# Patient Record
Sex: Female | Born: 1985
Health system: Southern US, Community
[De-identification: ages and names within clinical notes are randomized; demographics above are authoritative.]

## PROBLEM LIST (undated history)

## (undated) DIAGNOSIS — R112 Nausea with vomiting, unspecified: Secondary | ICD-10-CM

## (undated) DIAGNOSIS — Z9889 Other specified postprocedural states: Secondary | ICD-10-CM

## (undated) HISTORY — PX: DILATION AND CURETTAGE OF UTERUS: SHX78

## (undated) HISTORY — PX: TONSILLECTOMY: SUR1361

---

## 2017-08-17 DIAGNOSIS — J209 Acute bronchitis, unspecified: Secondary | ICD-10-CM | POA: Diagnosis not present

## 2017-10-07 ENCOUNTER — Other Ambulatory Visit: Payer: Self-pay

## 2017-10-07 ENCOUNTER — Ambulatory Visit (HOSPITAL_COMMUNITY)
Admission: EM | Admit: 2017-10-07 | Discharge: 2017-10-07 | Disposition: A | Discharge status: Home / Self Care | Payer: Federal, State, Local not specified - PPO | Attending: Physician Assistant | Admitting: Physician Assistant

## 2017-10-07 ENCOUNTER — Encounter (HOSPITAL_COMMUNITY): Payer: Self-pay | Admitting: Emergency Medicine

## 2017-10-07 DIAGNOSIS — N390 Urinary tract infection, site not specified: Secondary | ICD-10-CM | POA: Diagnosis not present

## 2017-10-07 LAB — POCT URINALYSIS DIP (DEVICE)
BILIRUBIN URINE: NEGATIVE
GLUCOSE, UA: NEGATIVE mg/dL
KETONES UR: NEGATIVE mg/dL
Nitrite: NEGATIVE
Protein, ur: NEGATIVE mg/dL
Specific Gravity, Urine: 1.01 (ref 1.005–1.030)
Urobilinogen, UA: 0.2 mg/dL (ref 0.0–1.0)
pH: 6 (ref 5.0–8.0)

## 2017-10-07 LAB — POCT PREGNANCY, URINE: PREG TEST UR: NEGATIVE

## 2017-10-07 MED ORDER — CIPROFLOXACIN HCL 500 MG PO TABS: 500.0000 mg | ORAL_TABLET | Freq: Two times a day (BID) | ORAL | 0 refills | Status: AC

## 2017-10-07 NOTE — ED Provider Notes (Addendum)
10/07/2017 7:02 PM   DOB: [birth year] / MRN: [id number]  SUBJECTIVE:  Brianna Hess is a 32 y.o. female presenting for   She has No Known Allergies.   She  has no past medical history on file.    She  reports that she has never smoked. She does not have any smokeless tobacco history on file. She reports that she drinks alcohol. She reports that she does not use drugs. She  has no sexual activity history on file. The patient  has a past surgical history that includes Cesarean section classical.  Her family history includes Healthy in her mother; Sarcoidosis in her father.  Review of Systems  Constitutional: Negative for chills, diaphoresis and fever.  Eyes: Negative.   Respiratory: Negative for shortness of breath.   Cardiovascular: Negative for chest pain, orthopnea and leg swelling.  Gastrointestinal: Negative for nausea.  Genitourinary: Positive for dysuria, flank pain, frequency, hematuria and urgency.  Skin: Negative for rash.  Neurological: Negative for dizziness, sensory change, speech change, focal weakness and headaches.    OBJECTIVE:  BP 136/86 (BP Location: Right Arm)   Pulse 64   Temp 97.6 F (36.4 C) (Oral)   Resp 18   LMP 09/21/2017   SpO2 97%   Physical Exam  Constitutional: She is active.  Non-toxic appearance.  Cardiovascular: Normal rate, regular rhythm, S1 normal, S2 normal, normal heart sounds and intact distal pulses. Exam reveals no gallop, no friction rub and no decreased pulses.  No murmur heard. Pulmonary/Chest: Effort normal. No stridor. No tachypnea. No respiratory distress. She has no wheezes. She has no rales.  Abdominal: Soft. Bowel sounds are normal. She exhibits no mass. There is no tenderness. There is no rebound and no guarding.  Musculoskeletal: She exhibits no edema.  Neurological: She is alert.  Skin: Skin is warm and dry. She is not diaphoretic. No pallor.    Results for orders placed or performed during the hospital encounter of  10/07/17 (from the past 72 hour(s))  POCT urinalysis dip (device)     Status: Abnormal   Collection Time: 10/07/17  6:33 PM  Result Value Ref Range   Glucose, UA NEGATIVE NEGATIVE mg/dL   Bilirubin Urine NEGATIVE NEGATIVE   Ketones, ur NEGATIVE NEGATIVE mg/dL   Specific Gravity, Urine 1.010 1.005 - 1.030   Hgb urine dipstick MODERATE (A) NEGATIVE   pH 6.0 5.0 - 8.0   Protein, ur NEGATIVE NEGATIVE mg/dL   Urobilinogen, UA 0.2 0.0 - 1.0 mg/dL   Nitrite NEGATIVE NEGATIVE   Leukocytes, UA MODERATE (A) NEGATIVE    Comment: Biochemical Testing Only. Please order routine urinalysis from main lab if confirmatory testing is needed.  Pregnancy, urine POC     Status: None   Collection Time: 10/07/17  6:40 PM  Result Value Ref Range   Preg Test, Ur NEGATIVE NEGATIVE    Comment:        THE SENSITIVITY OF THIS METHODOLOGY IS >24 mIU/mL     No results found.  ASSESSMENT AND PLAN:  Orders Placed This Encounter  Procedures  . Urine culture    Standing Status:   Standing    Number of Occurrences:   1  . POCT urinalysis dip (device)    Standing Status:   Standing    Number of Occurrences:   1  . Pregnancy, urine POC    Standing Status:   Standing    Number of Occurrences:   1     Lower urinary tract infectious disease: Despite  taking 3 days of Keflex and Septra she continues to have leukocytes.  She denies vaginal discharge.  She is trying to conceive a child at this time and does not want a KUB secondary to the radiation.  She will try to push fluids.  She will come back to my office at [address] if her symptoms are persisting so we can think about other etiologies and order tests as necessary.     The patient is advised to call or return to clinic if she does not see an improvement in symptoms, or to seek the care of the closest emergency department if she worsens with the above plan.   Deliah BostonMichael Able Malloy, MHS, PA-C 10/07/2017 7:02 PM    Ofilia Neaslark, Mendy Chou L, PA-C 10/07/17 1903     Ofilia Neaslark, Vitali Seibert L, PA-C 10/07/17 1903

## 2017-10-07 NOTE — ED Triage Notes (Signed)
History of uti.  Symptoms started one week ago.  Symptoms include dysuria, hematuria and vomiting.  Has had a 3 day course of keflex.  Symptoms did not go away.  Then had a 3 day course of bactrim.  Felt some improvement, but symptoms returned.

## 2017-10-07 NOTE — Discharge Instructions (Addendum)
You are currently not pregnant.  Please use protection throughout using the Cipro.  If this problem does not seem to resolve please come to [place] office [place] [address] [place] so we can consider our next steps.  Hopefully this will be the end of it.

## 2017-10-08 LAB — CERVICOVAGINAL ANCILLARY ONLY
Bacterial vaginitis: POSITIVE — AB
Candida vaginitis: NEGATIVE
Chlamydia: NEGATIVE
Neisseria Gonorrhea: NEGATIVE
Trichomonas: NEGATIVE

## 2017-10-08 NOTE — Progress Notes (Signed)
I have spoken with the patient regarding these results.  Given the extended duration of her symptoms and improvement with previous ABX therapy she would like to complete cipro before acting on the positive gardnerella.  [facility] is [facility] if her symptoms do not improve.

## 2017-10-09 LAB — URINE CULTURE

## 2017-12-31 DIAGNOSIS — Z3481 Encounter for supervision of other normal pregnancy, first trimester: Secondary | ICD-10-CM | POA: Diagnosis not present

## 2017-12-31 DIAGNOSIS — Z3480 Encounter for supervision of other normal pregnancy, unspecified trimester: Secondary | ICD-10-CM | POA: Diagnosis not present

## 2018-01-08 DIAGNOSIS — Z3201 Encounter for pregnancy test, result positive: Secondary | ICD-10-CM | POA: Diagnosis not present

## 2018-01-21 DIAGNOSIS — Z3A09 9 weeks gestation of pregnancy: Secondary | ICD-10-CM | POA: Diagnosis not present

## 2018-01-21 DIAGNOSIS — Z681 Body mass index (BMI) 19 or less, adult: Secondary | ICD-10-CM | POA: Diagnosis not present

## 2018-01-21 DIAGNOSIS — O209 Hemorrhage in early pregnancy, unspecified: Secondary | ICD-10-CM | POA: Diagnosis not present

## 2018-01-30 DIAGNOSIS — Z3689 Encounter for other specified antenatal screening: Secondary | ICD-10-CM | POA: Diagnosis not present

## 2018-01-30 DIAGNOSIS — Z124 Encounter for screening for malignant neoplasm of cervix: Secondary | ICD-10-CM | POA: Diagnosis not present

## 2018-01-30 DIAGNOSIS — Z3A10 10 weeks gestation of pregnancy: Secondary | ICD-10-CM | POA: Diagnosis not present

## 2018-01-30 DIAGNOSIS — O209 Hemorrhage in early pregnancy, unspecified: Secondary | ICD-10-CM | POA: Diagnosis not present

## 2018-01-30 LAB — OB RESULTS CONSOLE ANTIBODY SCREEN: Antibody Screen: POSITIVE

## 2018-01-30 LAB — OB RESULTS CONSOLE GC/CHLAMYDIA
Chlamydia: NEGATIVE
GC PROBE AMP, GENITAL: NEGATIVE

## 2018-01-30 LAB — OB RESULTS CONSOLE RPR: RPR: NONREACTIVE

## 2018-01-30 LAB — OB RESULTS CONSOLE ABO/RH: RH Type: NEGATIVE

## 2018-01-30 LAB — OB RESULTS CONSOLE RUBELLA ANTIBODY, IGM: RUBELLA: IMMUNE

## 2018-01-30 LAB — OB RESULTS CONSOLE HEPATITIS B SURFACE ANTIGEN: Hepatitis B Surface Ag: NEGATIVE

## 2018-01-30 LAB — OB RESULTS CONSOLE HIV ANTIBODY (ROUTINE TESTING): HIV: NONREACTIVE

## 2018-02-15 DIAGNOSIS — Z3682 Encounter for antenatal screening for nuchal translucency: Secondary | ICD-10-CM | POA: Diagnosis not present

## 2018-02-15 DIAGNOSIS — O209 Hemorrhage in early pregnancy, unspecified: Secondary | ICD-10-CM | POA: Diagnosis not present

## 2018-02-15 DIAGNOSIS — Z3A13 13 weeks gestation of pregnancy: Secondary | ICD-10-CM | POA: Diagnosis not present

## 2018-03-15 DIAGNOSIS — Z361 Encounter for antenatal screening for raised alphafetoprotein level: Secondary | ICD-10-CM | POA: Diagnosis not present

## 2018-03-15 DIAGNOSIS — Z3A16 16 weeks gestation of pregnancy: Secondary | ICD-10-CM | POA: Diagnosis not present

## 2018-03-15 DIAGNOSIS — O209 Hemorrhage in early pregnancy, unspecified: Secondary | ICD-10-CM | POA: Diagnosis not present

## 2018-04-01 DIAGNOSIS — Z363 Encounter for antenatal screening for malformations: Secondary | ICD-10-CM | POA: Diagnosis not present

## 2018-04-01 DIAGNOSIS — Z3A19 19 weeks gestation of pregnancy: Secondary | ICD-10-CM | POA: Diagnosis not present

## 2018-04-01 DIAGNOSIS — O4402 Placenta previa specified as without hemorrhage, second trimester: Secondary | ICD-10-CM | POA: Diagnosis not present

## 2018-05-02 DIAGNOSIS — Z3A23 23 weeks gestation of pregnancy: Secondary | ICD-10-CM | POA: Diagnosis not present

## 2018-05-02 DIAGNOSIS — O4402 Placenta previa specified as without hemorrhage, second trimester: Secondary | ICD-10-CM | POA: Diagnosis not present

## 2018-05-13 DIAGNOSIS — Z3689 Encounter for other specified antenatal screening: Secondary | ICD-10-CM | POA: Diagnosis not present

## 2018-05-13 DIAGNOSIS — O4402 Placenta previa specified as without hemorrhage, second trimester: Secondary | ICD-10-CM | POA: Diagnosis not present

## 2018-05-13 DIAGNOSIS — Z3A25 25 weeks gestation of pregnancy: Secondary | ICD-10-CM | POA: Diagnosis not present

## 2018-06-03 DIAGNOSIS — Z3A28 28 weeks gestation of pregnancy: Secondary | ICD-10-CM | POA: Diagnosis not present

## 2018-06-03 DIAGNOSIS — O4403 Placenta previa specified as without hemorrhage, third trimester: Secondary | ICD-10-CM | POA: Diagnosis not present

## 2018-06-03 DIAGNOSIS — Z23 Encounter for immunization: Secondary | ICD-10-CM | POA: Diagnosis not present

## 2018-06-03 DIAGNOSIS — O360130 Maternal care for anti-D [Rh] antibodies, third trimester, not applicable or unspecified: Secondary | ICD-10-CM | POA: Diagnosis not present

## 2018-06-03 DIAGNOSIS — Z3689 Encounter for other specified antenatal screening: Secondary | ICD-10-CM | POA: Diagnosis not present

## 2018-06-17 DIAGNOSIS — Z3A30 30 weeks gestation of pregnancy: Secondary | ICD-10-CM | POA: Diagnosis not present

## 2018-06-17 DIAGNOSIS — O360130 Maternal care for anti-D [Rh] antibodies, third trimester, not applicable or unspecified: Secondary | ICD-10-CM | POA: Diagnosis not present

## 2018-07-05 DIAGNOSIS — O360130 Maternal care for anti-D [Rh] antibodies, third trimester, not applicable or unspecified: Secondary | ICD-10-CM | POA: Diagnosis not present

## 2018-07-05 DIAGNOSIS — Z3A32 32 weeks gestation of pregnancy: Secondary | ICD-10-CM | POA: Diagnosis not present

## 2018-07-18 DIAGNOSIS — Z3A34 34 weeks gestation of pregnancy: Secondary | ICD-10-CM | POA: Diagnosis not present

## 2018-07-18 DIAGNOSIS — O360130 Maternal care for anti-D [Rh] antibodies, third trimester, not applicable or unspecified: Secondary | ICD-10-CM | POA: Diagnosis not present

## 2018-07-31 ENCOUNTER — Other Ambulatory Visit: Payer: Self-pay | Admitting: Obstetrics & Gynecology

## 2018-08-02 ENCOUNTER — Encounter (HOSPITAL_COMMUNITY): Payer: Self-pay | Admitting: *Deleted

## 2018-08-05 DIAGNOSIS — O360130 Maternal care for anti-D [Rh] antibodies, third trimester, not applicable or unspecified: Secondary | ICD-10-CM | POA: Diagnosis not present

## 2018-08-05 DIAGNOSIS — Z3685 Encounter for antenatal screening for Streptococcus B: Secondary | ICD-10-CM | POA: Diagnosis not present

## 2018-08-05 DIAGNOSIS — Z3A37 37 weeks gestation of pregnancy: Secondary | ICD-10-CM | POA: Diagnosis not present

## 2018-08-13 DIAGNOSIS — Z3A38 38 weeks gestation of pregnancy: Secondary | ICD-10-CM | POA: Diagnosis not present

## 2018-08-13 DIAGNOSIS — O4403 Placenta previa specified as without hemorrhage, third trimester: Secondary | ICD-10-CM | POA: Diagnosis not present

## 2018-08-15 NOTE — Patient Instructions (Signed)
Brianna Hess  08/15/2018   Your procedure is scheduled on:  08/19/2018  Enter through the Main Entrance of [facility] at 0930 AM.  Pick up the phone at the desk and dial [phone]  Call this number if you have problems the morning of surgery:[phone]  Remember:   Do not eat food:(After Midnight) Desps de medianoche.  Do not drink clear liquids: (After Midnight) Desps de medianoche.  Take these medicines the morning of surgery with A SIP OF WATER: may take zyrtec   Do not wear jewelry, make-up or nail polish.  Do not wear lotions, powders, or perfumes. Do not wear deodorant.  Do not shave 48 hours prior to surgery.  Do not bring valuables to the hospital.  [facility] is not   responsible for any belongings or valuables brought to the hospital.  Contacts, dentures or bridgework may not be worn into surgery.  Leave suitcase in the car. After surgery it may be brought to your room.  For patients admitted to the hospital, checkout time is 11:00 AM the day of              discharge.    N/A   Please read over the following fact sheets that you were given:   Surgical Site Infection Prevention

## 2018-08-16 ENCOUNTER — Encounter (HOSPITAL_COMMUNITY)
Admission: RE | Admit: 2018-08-16 | Discharge: 2018-08-16 | Disposition: A | Discharge status: Home / Self Care | Payer: Federal, State, Local not specified - PPO | Source: Ambulatory Visit | Attending: Obstetrics & Gynecology | Admitting: Obstetrics & Gynecology

## 2018-08-16 DIAGNOSIS — Z01812 Encounter for preprocedural laboratory examination: Secondary | ICD-10-CM | POA: Diagnosis not present

## 2018-08-16 HISTORY — DX: Other specified postprocedural states: R11.2

## 2018-08-16 HISTORY — DX: Nausea with vomiting, unspecified: Z98.890

## 2018-08-16 LAB — CBC
HCT: 37.9 % (ref 36.0–46.0)
HEMOGLOBIN: 12.7 g/dL (ref 12.0–15.0)
MCH: 32.2 pg (ref 26.0–34.0)
MCHC: 33.5 g/dL (ref 30.0–36.0)
MCV: 95.9 fL (ref 80.0–100.0)
Platelets: 223 10*3/uL (ref 150–400)
RBC: 3.95 MIL/uL (ref 3.87–5.11)
RDW: 13.9 % (ref 11.5–15.5)
WBC: 8.3 10*3/uL (ref 4.0–10.5)
nRBC: 0 % (ref 0.0–0.2)

## 2018-08-16 LAB — RPR: RPR: NONREACTIVE

## 2018-08-19 ENCOUNTER — Other Ambulatory Visit: Payer: Self-pay

## 2018-08-19 ENCOUNTER — Encounter (HOSPITAL_COMMUNITY)
Admission: AD | Disposition: A | Discharge status: Home / Self Care | Payer: Self-pay | Source: Home / Self Care | Attending: Obstetrics & Gynecology

## 2018-08-19 ENCOUNTER — Inpatient Hospital Stay (HOSPITAL_COMMUNITY): Payer: Federal, State, Local not specified - PPO | Admitting: Anesthesiology

## 2018-08-19 ENCOUNTER — Encounter (HOSPITAL_COMMUNITY): Payer: Self-pay | Admitting: *Deleted

## 2018-08-19 ENCOUNTER — Inpatient Hospital Stay (HOSPITAL_COMMUNITY)
Admission: AD | Admit: 2018-08-19 | Discharge: 2018-08-21 | DRG: 787 | Disposition: A | Discharge status: Home / Self Care | Payer: Federal, State, Local not specified - PPO | Attending: Obstetrics & Gynecology | Admitting: Obstetrics & Gynecology

## 2018-08-19 DIAGNOSIS — D62 Acute posthemorrhagic anemia: Secondary | ICD-10-CM | POA: Diagnosis not present

## 2018-08-19 DIAGNOSIS — Z3A39 39 weeks gestation of pregnancy: Secondary | ICD-10-CM | POA: Diagnosis not present

## 2018-08-19 DIAGNOSIS — Z98891 History of uterine scar from previous surgery: Secondary | ICD-10-CM

## 2018-08-19 DIAGNOSIS — O26893 Other specified pregnancy related conditions, third trimester: Secondary | ICD-10-CM | POA: Diagnosis not present

## 2018-08-19 DIAGNOSIS — O34211 Maternal care for low transverse scar from previous cesarean delivery: Secondary | ICD-10-CM | POA: Diagnosis not present

## 2018-08-19 DIAGNOSIS — O9081 Anemia of the puerperium: Secondary | ICD-10-CM | POA: Diagnosis not present

## 2018-08-19 DIAGNOSIS — Z6791 Unspecified blood type, Rh negative: Secondary | ICD-10-CM

## 2018-08-19 DIAGNOSIS — O34219 Maternal care for unspecified type scar from previous cesarean delivery: Secondary | ICD-10-CM | POA: Diagnosis not present

## 2018-08-19 SURGERY — Surgical Case
Anesthesia: Spinal

## 2018-08-19 MED ORDER — ONDANSETRON HCL 4 MG/2ML IJ SOLN
INTRAMUSCULAR | Status: DC | PRN
  Administered 2018-08-19: 4 mg via INTRAVENOUS

## 2018-08-19 MED ORDER — DEXAMETHASONE SODIUM PHOSPHATE 4 MG/ML IJ SOLN
INTRAMUSCULAR | Status: AC
  Filled 2018-08-19: qty 1

## 2018-08-19 MED ORDER — PHENYLEPHRINE 40 MCG/ML (10ML) SYRINGE FOR IV PUSH (FOR BLOOD PRESSURE SUPPORT)
PREFILLED_SYRINGE | INTRAVENOUS | Status: AC
  Filled 2018-08-19: qty 10

## 2018-08-19 MED ORDER — DIPHENHYDRAMINE HCL 50 MG/ML IJ SOLN: 12.5000 mg | INTRAMUSCULAR | Status: DC | PRN

## 2018-08-19 MED ORDER — NALBUPHINE HCL 10 MG/ML IJ SOLN: 5.0000 mg | INTRAMUSCULAR | Status: DC | PRN

## 2018-08-19 MED ORDER — LACTATED RINGERS IV SOLN
INTRAVENOUS | Status: DC | PRN
  Administered 2018-08-19: 12:00:00 via INTRAVENOUS

## 2018-08-19 MED ORDER — METOCLOPRAMIDE HCL 5 MG/ML IJ SOLN
INTRAMUSCULAR | Status: DC | PRN
  Administered 2018-08-19: 10 mg via INTRAVENOUS

## 2018-08-19 MED ORDER — SODIUM CHLORIDE 0.9% FLUSH: 3.0000 mL | INTRAVENOUS | Status: DC | PRN

## 2018-08-19 MED ORDER — CEFAZOLIN SODIUM-DEXTROSE 2-4 GM/100ML-% IV SOLN
2.0000 g | INTRAVENOUS | Status: AC
  Administered 2018-08-19: 2 g via INTRAVENOUS

## 2018-08-19 MED ORDER — SIMETHICONE 80 MG PO CHEW
80.0000 mg | CHEWABLE_TABLET | Freq: Three times a day (TID) | ORAL | Status: DC
  Administered 2018-08-20 – 2018-08-21 (×4): 80 mg via ORAL
  Filled 2018-08-19 (×5): qty 1

## 2018-08-19 MED ORDER — OXYTOCIN 40 UNITS IN NORMAL SALINE INFUSION - SIMPLE MED: 2.5000 [IU]/h | INTRAVENOUS | Status: DC

## 2018-08-19 MED ORDER — OXYCODONE HCL 5 MG PO TABS: 5.0000 mg | ORAL_TABLET | Freq: Four times a day (QID) | ORAL | Status: DC | PRN

## 2018-08-19 MED ORDER — DEXAMETHASONE SODIUM PHOSPHATE 4 MG/ML IJ SOLN
INTRAMUSCULAR | Status: DC | PRN
  Administered 2018-08-19: 4 mg via INTRAVENOUS

## 2018-08-19 MED ORDER — DIBUCAINE 1 % RE OINT: 1.0000 "application " | TOPICAL_OINTMENT | RECTAL | Status: DC | PRN

## 2018-08-19 MED ORDER — PRENATAL MULTIVITAMIN CH
1.0000 | ORAL_TABLET | Freq: Every day | ORAL | Status: DC
  Administered 2018-08-20: 1 via ORAL
  Filled 2018-08-19: qty 1

## 2018-08-19 MED ORDER — DIPHENHYDRAMINE HCL 25 MG PO CAPS
25.0000 mg | ORAL_CAPSULE | ORAL | Status: DC | PRN
  Filled 2018-08-19: qty 1

## 2018-08-19 MED ORDER — SIMETHICONE 80 MG PO CHEW: 80.0000 mg | CHEWABLE_TABLET | ORAL | Status: DC | PRN

## 2018-08-19 MED ORDER — PHENYLEPHRINE 8 MG IN D5W 100 ML (0.08MG/ML) PREMIX OPTIME
INJECTION | INTRAVENOUS | Status: DC | PRN
  Administered 2018-08-19: 60 ug/min via INTRAVENOUS

## 2018-08-19 MED ORDER — NALOXONE HCL 0.4 MG/ML IJ SOLN: 0.4000 mg | INTRAMUSCULAR | Status: DC | PRN

## 2018-08-19 MED ORDER — OXYTOCIN 10 UNIT/ML IJ SOLN
INTRAMUSCULAR | Status: AC
  Filled 2018-08-19: qty 4

## 2018-08-19 MED ORDER — SIMETHICONE 80 MG PO CHEW
80.0000 mg | CHEWABLE_TABLET | ORAL | Status: DC
  Administered 2018-08-19 – 2018-08-20 (×2): 80 mg via ORAL
  Filled 2018-08-19 (×2): qty 1

## 2018-08-19 MED ORDER — SENNOSIDES-DOCUSATE SODIUM 8.6-50 MG PO TABS
2.0000 | ORAL_TABLET | ORAL | Status: DC
  Administered 2018-08-19 – 2018-08-20 (×2): 2 via ORAL
  Filled 2018-08-19 (×2): qty 2

## 2018-08-19 MED ORDER — ACETAMINOPHEN 325 MG PO TABS
650.0000 mg | ORAL_TABLET | ORAL | Status: DC | PRN
  Administered 2018-08-20 – 2018-08-21 (×3): 650 mg via ORAL
  Filled 2018-08-19 (×2): qty 2

## 2018-08-19 MED ORDER — MEPERIDINE HCL 25 MG/ML IJ SOLN
INTRAMUSCULAR | Status: AC
  Filled 2018-08-19: qty 1

## 2018-08-19 MED ORDER — BUPIVACAINE IN DEXTROSE 0.75-8.25 % IT SOLN
INTRATHECAL | Status: DC | PRN
  Administered 2018-08-19: 1.8 mL via INTRATHECAL

## 2018-08-19 MED ORDER — PHENYLEPHRINE 8 MG IN D5W 100 ML (0.08MG/ML) PREMIX OPTIME
INJECTION | INTRAVENOUS | Status: AC
  Filled 2018-08-19: qty 100

## 2018-08-19 MED ORDER — NALBUPHINE HCL 10 MG/ML IJ SOLN: 5.0000 mg | Freq: Once | INTRAMUSCULAR | Status: DC | PRN

## 2018-08-19 MED ORDER — DIPHENHYDRAMINE HCL 25 MG PO CAPS: 25.0000 mg | ORAL_CAPSULE | Freq: Four times a day (QID) | ORAL | Status: DC | PRN

## 2018-08-19 MED ORDER — ZOLPIDEM TARTRATE 5 MG PO TABS: 5.0000 mg | ORAL_TABLET | Freq: Every evening | ORAL | Status: DC | PRN

## 2018-08-19 MED ORDER — MEPERIDINE HCL 25 MG/ML IJ SOLN
INTRAMUSCULAR | Status: DC | PRN
  Administered 2018-08-19 (×2): 12.5 mg via INTRAVENOUS

## 2018-08-19 MED ORDER — LACTATED RINGERS IV SOLN
INTRAVENOUS | Status: DC
  Administered 2018-08-19: 125 mL/h via INTRAVENOUS

## 2018-08-19 MED ORDER — ONDANSETRON HCL 4 MG/2ML IJ SOLN
INTRAMUSCULAR | Status: AC
  Filled 2018-08-19: qty 2

## 2018-08-19 MED ORDER — ONDANSETRON HCL 4 MG/2ML IJ SOLN: 4.0000 mg | Freq: Three times a day (TID) | INTRAMUSCULAR | Status: DC | PRN

## 2018-08-19 MED ORDER — SCOPOLAMINE 1 MG/3DAYS TD PT72
MEDICATED_PATCH | TRANSDERMAL | Status: AC
  Filled 2018-08-19: qty 1

## 2018-08-19 MED ORDER — SCOPOLAMINE 1 MG/3DAYS TD PT72
MEDICATED_PATCH | TRANSDERMAL | Status: DC | PRN
  Administered 2018-08-19: 1 via TRANSDERMAL

## 2018-08-19 MED ORDER — WITCH HAZEL-GLYCERIN EX PADS: 1.0000 "application " | MEDICATED_PAD | CUTANEOUS | Status: DC | PRN

## 2018-08-19 MED ORDER — MORPHINE SULFATE (PF) 0.5 MG/ML IJ SOLN
INTRAMUSCULAR | Status: DC | PRN
  Administered 2018-08-19: 150 ug via INTRATHECAL

## 2018-08-19 MED ORDER — MENTHOL 3 MG MT LOZG
1.0000 | LOZENGE | OROMUCOSAL | Status: DC | PRN
  Administered 2018-08-19: 3 mg via ORAL
  Filled 2018-08-19: qty 9

## 2018-08-19 MED ORDER — MORPHINE SULFATE (PF) 0.5 MG/ML IJ SOLN
INTRAMUSCULAR | Status: AC
  Filled 2018-08-19: qty 10

## 2018-08-19 MED ORDER — LACTATED RINGERS IV SOLN
INTRAVENOUS | Status: DC
  Administered 2018-08-19 (×2): via INTRAVENOUS

## 2018-08-19 MED ORDER — TETANUS-DIPHTH-ACELL PERTUSSIS 5-2.5-18.5 LF-MCG/0.5 IM SUSP: 0.5000 mL | Freq: Once | INTRAMUSCULAR | Status: DC

## 2018-08-19 MED ORDER — IBUPROFEN 800 MG PO TABS
800.0000 mg | ORAL_TABLET | Freq: Three times a day (TID) | ORAL | Status: DC
  Administered 2018-08-19 – 2018-08-21 (×6): 800 mg via ORAL
  Filled 2018-08-19 (×6): qty 1

## 2018-08-19 MED ORDER — NALOXONE HCL 4 MG/10ML IJ SOLN
1.0000 ug/kg/h | INTRAVENOUS | Status: DC | PRN
  Filled 2018-08-19: qty 5

## 2018-08-19 MED ORDER — FENTANYL CITRATE (PF) 100 MCG/2ML IJ SOLN
INTRAMUSCULAR | Status: AC
  Filled 2018-08-19: qty 2

## 2018-08-19 MED ORDER — SODIUM CHLORIDE 0.9 % IR SOLN
Status: DC | PRN
  Administered 2018-08-19: 1

## 2018-08-19 MED ORDER — SCOPOLAMINE 1 MG/3DAYS TD PT72: 1.0000 | MEDICATED_PATCH | Freq: Once | TRANSDERMAL | Status: DC

## 2018-08-19 MED ORDER — FENTANYL CITRATE (PF) 100 MCG/2ML IJ SOLN
INTRAMUSCULAR | Status: DC | PRN
  Administered 2018-08-19: 15 ug via INTRATHECAL

## 2018-08-19 MED ORDER — LORATADINE 10 MG PO TABS: 10.0000 mg | ORAL_TABLET | Freq: Every day | ORAL | Status: DC

## 2018-08-19 MED ORDER — COCONUT OIL OIL: 1.0000 "application " | TOPICAL_OIL | Status: DC | PRN

## 2018-08-19 MED ORDER — METOCLOPRAMIDE HCL 5 MG/ML IJ SOLN
INTRAMUSCULAR | Status: AC
  Filled 2018-08-19: qty 2

## 2018-08-19 MED ORDER — OXYTOCIN 10 UNIT/ML IJ SOLN
INTRAVENOUS | Status: DC | PRN
  Administered 2018-08-19: 40 [IU] via INTRAVENOUS

## 2018-08-19 MED ORDER — LORATADINE 10 MG PO TABS
10.0000 mg | ORAL_TABLET | Freq: Every day | ORAL | Status: DC
  Administered 2018-08-19 – 2018-08-20 (×2): 10 mg via ORAL
  Filled 2018-08-19 (×2): qty 1

## 2018-08-19 SURGICAL SUPPLY — 35 items
BENZOIN TINCTURE PRP APPL 2/3 (GAUZE/BANDAGES/DRESSINGS) ×2 IMPLANT
CHLORAPREP W/TINT 26ML (MISCELLANEOUS) ×2 IMPLANT
CLAMP CORD UMBIL (MISCELLANEOUS) IMPLANT
CLOTH BEACON ORANGE TIMEOUT ST (SAFETY) ×2 IMPLANT
DRAPE C SECTION CLR SCREEN (DRAPES) ×2 IMPLANT
DRSG OPSITE POSTOP 4X10 (GAUZE/BANDAGES/DRESSINGS) ×2 IMPLANT
ELECT REM PT RETURN 9FT ADLT (ELECTROSURGICAL) ×2
ELECTRODE REM PT RTRN 9FT ADLT (ELECTROSURGICAL) ×1 IMPLANT
EXTRACTOR VACUUM KIWI (MISCELLANEOUS) IMPLANT
EXTRACTOR VACUUM M CUP 4 TUBE (SUCTIONS) IMPLANT
GLOVE BIO SURGEON STRL SZ7 (GLOVE) ×2 IMPLANT
GLOVE BIOGEL PI IND STRL 7.0 (GLOVE) ×2 IMPLANT
GLOVE BIOGEL PI INDICATOR 7.0 (GLOVE) ×2
GOWN STRL REUS W/TWL LRG LVL3 (GOWN DISPOSABLE) ×4 IMPLANT
KIT ABG SYR 3ML LUER SLIP (SYRINGE) IMPLANT
NEEDLE HYPO 25X5/8 SAFETYGLIDE (NEEDLE) IMPLANT
NS IRRIG 1000ML POUR BTL (IV SOLUTION) ×2 IMPLANT
PACK C SECTION WH (CUSTOM PROCEDURE TRAY) ×2 IMPLANT
PAD OB MATERNITY 4.3X12.25 (PERSONAL CARE ITEMS) ×2 IMPLANT
RTRCTR C-SECT PINK 25CM LRG (MISCELLANEOUS) IMPLANT
STRIP CLOSURE SKIN 1/2X4 (GAUZE/BANDAGES/DRESSINGS) ×2 IMPLANT
SUT MNCRL 0 VIOLET CTX 36 (SUTURE) ×2 IMPLANT
SUT MONOCRYL 0 CTX 36 (SUTURE) ×2
SUT PLAIN 0 NONE (SUTURE) IMPLANT
SUT PLAIN 2 0 (SUTURE)
SUT PLAIN ABS 2-0 CT1 27XMFL (SUTURE) IMPLANT
SUT VIC AB 0 CT1 27 (SUTURE) ×2
SUT VIC AB 0 CT1 27XBRD ANBCTR (SUTURE) ×2 IMPLANT
SUT VIC AB 2-0 CT1 27 (SUTURE) ×1
SUT VIC AB 2-0 CT1 TAPERPNT 27 (SUTURE) ×1 IMPLANT
SUT VIC AB 4-0 KS 27 (SUTURE) ×2 IMPLANT
SUT VICRYL 0 TIES 12 18 (SUTURE) IMPLANT
TOWEL OR 17X24 6PK STRL BLUE (TOWEL DISPOSABLE) ×2 IMPLANT
TRAY FOLEY W/BAG SLVR 14FR LF (SET/KITS/TRAYS/PACK) IMPLANT
WATER STERILE IRR 1000ML POUR (IV SOLUTION) ×2 IMPLANT

## 2018-08-19 NOTE — Op Note (Signed)
Cesarean Section Procedure Note   Brianna Hess  08/19/2018  Indications: Scheduled Proceedure/Maternal Request   Pre-operative Diagnosis: Previous Cesarean Section. 39 weeks  Post-operative Diagnosis: Same   Surgeon: Shea Evans, MD  Assistants: Arlan Organ, CNM  Anesthesia: spinal   Procedure Details:  The patient was seen in the Holding Room. The risks, benefits, complications, treatment options, and expected outcomes were discussed with the patient. The patient concurred with the proposed plan, giving informed consent. identified as Brianna Hess and the procedure verified as C-Section Delivery. A Time Out was held and the above information confirmed.  After induction of anesthesia, the patient was draped and prepped in the usual sterile manner, foley was draining urine well.  A pfannenstiel incision was made and carried down through the subcutaneous tissue to the fascia. Fascial incision was made and extended transversely. The fascia was separated from the underlying rectus tissue superiorly and inferiorly. The peritoneum was identified and entered. Peritoneal incision was extended longitudinally. Alexis-O retractor placed. The utero-vesical peritoneal reflection was incised transversely and the bladder flap was bluntly freed from the lower uterine segment. A low transverse uterine incision was made. Delivered from cephalic presentation was a Female infant with vigorous cry. Apgar scores of 9 at one minute and 9 at five minutes. Delayed cord clamping done at 1 minute and baby handed to NICU team in attendance. Cord ph was not sent. Cord blood was obtained for evaluation. The placenta was removed Intact and appeared normal. The uterine outline, tubes and ovaries appeared normal}. The uterine incision was closed with running locked sutures of . A second imbricating layer sutured.   Hemostasis was observed. Alexis retractor removed. Peritoneal closure done with 2-0 Vicryl.  The  fascia was then reapproximated with running sutures of 0Vicryl. The subcuticular closure was performed using 2-0plain gut. The skin was closed with 4-0Vicryl. Sterile dressing, steristrips placed.   Instrument, sponge, and needle counts were correct prior the abdominal closure and were correct at the conclusion of the case.   Findings: Female infant, delivered cephalic via Kerr hysterotomy. Clear amniotic fluid. Nl tubes, ovaries.    Estimated Blood Loss: 350 mL   Total IV Fluids: 2300 ml LR  Urine Output: 200CC OF clear urine  Specimens: cord blood    Complications: no complications  Disposition: PACU - hemodynamically stable.   Maternal Condition: stable   Baby condition / location:  Couplet care / Skin to Skin  Attending Attestation: I performed the procedure.   Signed: Surgeon(s): Shea Evans, MD

## 2018-08-19 NOTE — Lactation Note (Signed)
This note was copied from a baby's chart. Lactation Consultation Note  Patient Name: Brianna Hess JASNK'N Date: 08/19/2018 Reason for consult: Initial assessment;Term  P2 mother whose infant is now 20 hours old.  She breast fed her first child (now 2 years) for 15 months  Encouraged to feed 8-12 times/24 hours or sooner if baby shows feeding cues.  Reviewed feeding cues.  Mother is familiar with hand expression and colostrum container provided for any EBM she obtains.  Finger feeding demonstrated and milk storage times reviewed.    Mom made aware of O/P services, breastfeeding support groups, community resources, and our phone # for post-discharge questions.  Mother has a DEBP for home use.  Father present.  Mother will call for assistance as needed.   Maternal Data Formula Feeding for Exclusion: No Has patient been taught Hand Expression?: Yes Does the patient have breastfeeding experience prior to this delivery?: Yes  Feeding Feeding Type: Breast Fed  LATCH Score Latch: Repeated attempts needed to sustain latch, nipple held in mouth throughout feeding, stimulation needed to elicit sucking reflex.  Audible Swallowing: A few with stimulation  Type of Nipple: Everted at rest and after stimulation  Comfort (Breast/Nipple): Soft / non-tender  Hold (Positioning): No assistance needed to correctly position infant at breast.  LATCH Score: 8  Interventions    Lactation Tools Discussed/Used WIC Program: No   Consult Status Consult Status: Follow-up Date: 08/20/18 Follow-up type: In-patient    [name] 08/19/2018, 5:31 PM

## 2018-08-19 NOTE — Anesthesia Preprocedure Evaluation (Addendum)
Anesthesia Evaluation  Patient identified by MRN, date of birth, ID band Patient awake    Reviewed: Allergy & Precautions, NPO status , Patient's Chart, lab work & pertinent test results  History of Anesthesia Complications (+) PONV and history of anesthetic complications  Airway Mallampati: I  TM Distance: >3 FB Neck ROM: Full    Dental no notable dental hx. (+) Teeth Intact, Dental Advisory Given   Pulmonary neg pulmonary ROS,    Pulmonary exam normal breath sounds clear to auscultation       Cardiovascular negative cardio ROS Normal cardiovascular exam Rhythm:Regular Rate:Normal     Neuro/Psych negative neurological ROS  negative psych ROS   GI/Hepatic negative GI ROS, Neg liver ROS,   Endo/Other  negative endocrine ROS  Renal/GU negative Renal ROS  negative genitourinary   Musculoskeletal negative musculoskeletal ROS (+)   Abdominal   Peds  Hematology negative hematology ROS (+)   Anesthesia Other Findings   Reproductive/Obstetrics (+) Pregnancy                            Anesthesia Physical Anesthesia Plan  ASA: II  Anesthesia Plan: Spinal   Post-op Pain Management:    Induction:   PONV Risk Score and Plan: Treatment may vary due to age or medical condition  Airway Management Planned: Natural Airway  Additional Equipment:   Intra-op Plan:   Post-operative Plan:   Informed Consent: I have reviewed the patients History and Physical, chart, labs and discussed the procedure including the risks, benefits and alternatives for the proposed anesthesia with the patient or authorized representative who has indicated his/her understanding and acceptance.     Dental advisory given  Plan Discussed with: CRNA  Anesthesia Plan Comments:         Anesthesia Quick Evaluation

## 2018-08-19 NOTE — H&P (Signed)
Brianna Hess is a 33 y.o. female presenting for elective repeat C/section at 39+ wks. Prior LTCS for arrest of descent. No complaints. +FMs, no UCs/ LOF/VB.  Uncomplicated prenatal care, since 7-8 wks, EDC by 1st trim sono, Previa earlier in pregnancy resolved at 25 wks.   Rh negative, got Rhogam Not planning more kids but declines sterilization.   OB History    Gravida  2   Para  1   Term      Preterm      AB      Living        SAB      TAB      Ectopic      Multiple      Live Births             Past Medical History:  Diagnosis Date  . PONV (postoperative nausea and vomiting)    Past Surgical History:  Procedure Laterality Date  . CESAREAN SECTION    . CESAREAN SECTION CLASSICAL    . DILATION AND CURETTAGE OF UTERUS     uterine polyps  . TONSILLECTOMY     Family History: family history includes Healthy in her mother; Sarcoidosis in her father. Social History:  reports that she has never smoked. She has never used smokeless tobacco. She reports current alcohol use. She reports that she does not use drugs.     Maternal Diabetes: No Genetic Screening: Normal Maternal Ultrasounds/Referrals: Normal Fetal Ultrasounds or other Referrals:  None Maternal Substance Abuse:  No Significant Maternal Medications:  None Significant Maternal Lab Results:  Lab values include: Group B Strep negative, Rh negative Other Comments:  None  ROS History   Blood pressure (!) 104/59, pulse 75, temperature 97.7 F (36.5 C), temperature source Oral, resp. rate 20, height 5\' 8"  (1.727 m), weight 71.3 kg. Exam Physical Exam  Physical exam:  A&O x 3, no acute distress. Pleasant HEENT neg, no thyromegaly Lungs CTA bilat CV RRR, S1S2 normal Abdo soft, non tender, non acute Extr no edema/ tenderness Pelvic def FHT  130s Toco none   Prenatal labs: ABO, Rh: --/--/O NEG (01/31 16100858) Antibody: POS (01/31 0858) Rubella: Immune (07/17 0000) RPR: Non Reactive (01/31 0858)   HBsAg: Negative (07/17 0000)  HIV: Non-reactive (07/17 0000)  GBS:   neg Ultrascreen neg. AFP1 nl Glucola nl   Assessment/Plan: 33 yo G2P1001 39+ wks. Here for scheduled repeat C-section Risks/complications of surgery reviewed incl infection, bleeding, damage to internal organs including bladder, bowels, ureters, blood vessels, other risks from anesthesia, VTE and delayed complications of any surgery, complications in future surgery reviewed. Also discussed neonatal complications incl difficult delivery, laceration, vacuum assistance, TTN etc. Pt understands and agrees, all concerns addressed.    Planning IUD pp in office    Robley FriesVaishali R Jamiyla Ishee 08/19/2018, 10:39 AM

## 2018-08-19 NOTE — Lactation Note (Signed)
This note was copied from a baby's chart. Lactation Consultation Note  Patient Name: Brianna Hess [id number] Date: 08/19/2018 Reason for consult: Initial assessment;Term   Maternal Data Formula Feeding for Exclusion: No Has patient been taught Hand Expression?: Yes Does the patient have breastfeeding experience prior to this delivery?: Yes  Feeding Feeding Type: Breast Fed  LATCH Score Latch: Repeated attempts needed to sustain latch, nipple held in mouth throughout feeding, stimulation needed to elicit sucking reflex.  Audible Swallowing: A few with stimulation  Type of Nipple: Everted at rest and after stimulation  Comfort (Breast/Nipple): Soft / non-tender  Hold (Positioning): No assistance needed to correctly position infant at breast.  LATCH Score: 8  Interventions    Lactation Tools Discussed/Used WIC Program: No   Consult Status Consult Status: Follow-up Date: 08/20/18 Follow-up type: In-patient    [name] 08/19/2018, 6:39 PM

## 2018-08-19 NOTE — Transfer of Care (Signed)
Immediate Anesthesia Transfer of Care Note  Patient: Brianna Hess  Procedure(s) Performed: Repeat CESAREAN SECTION (N/A )  Patient Location: PACU  Anesthesia Type:Spinal  Level of Consciousness: awake, alert  and oriented  Airway & Oxygen Therapy: Patient Spontanous Breathing  Post-op Assessment: Report given to RN and Post -op Vital signs reviewed and stable  Post vital signs: Reviewed and stable  Last Vitals:  Vitals Value Taken Time  BP    Temp    Pulse 72 08/19/2018 12:50 PM  Resp 21 08/19/2018 12:50 PM  SpO2 99 % 08/19/2018 12:50 PM  Vitals shown include unvalidated device data.  Last Pain:  Vitals:   08/19/18 1019  TempSrc: Oral  PainSc: 0-No pain         Complications: No apparent anesthesia complications

## 2018-08-19 NOTE — Anesthesia Postprocedure Evaluation (Signed)
Anesthesia Post Note  Patient: Brianna Hess  Procedure(s) Performed: Repeat CESAREAN SECTION (N/A )     Patient location during evaluation: Mother Baby Anesthesia Type: Spinal Level of consciousness: awake Pain management: satisfactory to patient Vital Signs Assessment: post-procedure vital signs reviewed and stable Respiratory status: spontaneous breathing Cardiovascular status: stable Anesthetic complications: no    Last Vitals:  Vitals:   08/19/18 1942 08/19/18 1947  BP:    Pulse:    Resp:    Temp:    SpO2: 98% 97%    Last Pain:  Vitals:   08/19/18 1936  TempSrc:   PainSc: 2    Pain Goal:                Epidural/Spinal Function Cutaneous sensation: Normal sensation (08/19/18 1936)  Cephus Shelling

## 2018-08-20 LAB — BPAM RBC
BLOOD PRODUCT EXPIRATION DATE: 202002182359
Blood Product Expiration Date: 202002192359
Unit Type and Rh: 9500
Unit Type and Rh: 9500

## 2018-08-20 LAB — CBC
HCT: 32.3 % — ABNORMAL LOW (ref 36.0–46.0)
Hemoglobin: 10.8 g/dL — ABNORMAL LOW (ref 12.0–15.0)
MCH: 31.6 pg (ref 26.0–34.0)
MCHC: 33.4 g/dL (ref 30.0–36.0)
MCV: 94.4 fL (ref 80.0–100.0)
NRBC: 0 % (ref 0.0–0.2)
Platelets: 191 10*3/uL (ref 150–400)
RBC: 3.42 MIL/uL — ABNORMAL LOW (ref 3.87–5.11)
RDW: 14 % (ref 11.5–15.5)
WBC: 12.9 10*3/uL — ABNORMAL HIGH (ref 4.0–10.5)

## 2018-08-20 LAB — TYPE AND SCREEN
ABO/RH(D): O NEG
Antibody Screen: POSITIVE
Unit division: 0
Unit division: 0

## 2018-08-20 LAB — BIRTH TISSUE RECOVERY COLLECTION (PLACENTA DONATION)

## 2018-08-20 MED ORDER — RHO D IMMUNE GLOBULIN 1500 UNIT/2ML IJ SOSY
300.0000 ug | PREFILLED_SYRINGE | Freq: Once | INTRAMUSCULAR | Status: AC
  Administered 2018-08-20: 300 ug via INTRAVENOUS
  Filled 2018-08-20: qty 2

## 2018-08-20 MED ORDER — GUAIFENESIN-DM 100-10 MG/5ML PO SYRP
5.0000 mL | ORAL_SOLUTION | ORAL | Status: DC | PRN
  Administered 2018-08-20 – 2018-08-21 (×3): 5 mL via ORAL
  Filled 2018-08-20 (×4): qty 5

## 2018-08-20 NOTE — Addendum Note (Signed)
Addendum  created 08/20/18 1124 by Elmer Picker, MD   Attestation recorded in Intraprocedure, Intraprocedure Attestations filed

## 2018-08-20 NOTE — Anesthesia Procedure Notes (Signed)
Spinal  Patient location during procedure: OR Start time: 08/19/2018 11:40 AM End time: 08/19/2018 11:50 AM Staffing Anesthesiologist: Elmer Picker, MD Performed: anesthesiologist  Preanesthetic Checklist Completed: patient identified, surgical consent, pre-op evaluation, timeout performed, IV checked, risks and benefits discussed and monitors and equipment checked Spinal Block Patient position: sitting Prep: site prepped and draped and DuraPrep Patient monitoring: cardiac monitor, continuous pulse ox and blood pressure Approach: midline Location: L3-4 Injection technique: single-shot Needle Needle type: Pencan  Needle gauge: 24 G Needle length: 9 cm Assessment Sensory level: T6 Additional Notes Functioning IV was confirmed and monitors were applied. Sterile prep and drape, including hand hygiene and sterile gloves were used. The patient was positioned and the spine was prepped. The skin was anesthetized with lidocaine.  Free flow of clear CSF was obtained prior to injecting local anesthetic into the CSF.  The spinal needle aspirated freely following injection.  The needle was carefully withdrawn.  The patient tolerated the procedure well.

## 2018-08-20 NOTE — Progress Notes (Signed)
Received call from RN regarding pt c/o dry cough with some chest congestion.  Pt. Requesting Robitussin DM.  Also reports sore, scratchy throat and has been using Cepacol lozenges. No fever or chills. Denies s/s of the flu. Continue to monitor closely and call with worsening s/s. Robitussin DM PRN ordered.   Carlean JewsMeredith Jalyah Weinheimer, CNM

## 2018-08-20 NOTE — Addendum Note (Signed)
Addendum  created 08/20/18 1126 by Elmer Picker, MD   Child order released for a procedure order, Clinical Note Signed, Intraprocedure Blocks edited

## 2018-08-20 NOTE — Progress Notes (Signed)
POSTOPERATIVE DAY # 1 S/P Repeat LTCS, baby girl "[place]"   S:         Reports feeling well, some incisional pain, but tolerable              Tolerating po intake / no nausea / no vomiting / + flatus / no BM  Denies dizziness, SOB, or CP             Bleeding is light             Pain controlled with Motrin             Up ad lib / ambulatory/ voiding QS x 3 since foley cath removal   Newborn breast feeding - going well    O:  VS: BP 96/65 (BP Location: Left Arm)   Pulse 65   Temp 98.3 F (36.8 C) (Oral)   Resp 18   Ht 5\' 8"  (1.727 m)   Wt 71.3 kg   SpO2 98%   Breastfeeding Unknown   BMI 23.89 kg/m    LABS:               Recent Labs    08/20/18 0545  WBC 12.9*  HGB 10.8*  PLT 191               Bloodtype: --/--/O NEG (01/31 0858)  Rubella: Immune (07/17 0000)                                             I&O: Intake/Output      02/03 0701 - 02/04 0700 02/04 0701 - 02/05 0700   P.O. 1780    I.V. (mL/kg) 3500 (49.1)    Total Intake(mL/kg) 5280 (74.1)    Urine (mL/kg/hr) 5275 275 (1.5)   Blood 350    Total Output 5625 275   Net -345 -275                     Physical Exam:             Alert and Oriented X3  Lungs: Clear and unlabored  Heart: regular rate and rhythm / no murmurs  Abdomen: soft, non-tender, non-distended, active bowel sounds in all quadrants              Fundus: firm, non-tender, U-3             Dressing: honeycomb with steristrips c/d/i              Incision:  approximated with sutures / no erythema / no ecchymosis / no drainage  Perineum: intact  Lochia: small, no clots   Extremities: no edema, no calf pain or tenderness,   A/P:    POD # 1 S/P Repeat LTCS            RH Negative, baby O Positive   - Give Rhogam today   ABL Anemia    - Stable, asymptomatic  Routine postoperative care              D/C IV today after Rhogam  Encouraged ambulation and warm fluids to promote bowel motility  May shower today  See lactation  Anticipate early  discharge home tomorrow   Carlean Jews, MSN, CNM [facility]

## 2018-08-21 LAB — RH IG WORKUP (INCLUDES ABO/RH)
ABO/RH(D): O NEG
Antibody Screen: NEGATIVE
Fetal Screen: NEGATIVE
Gestational Age(Wks): 39.1
Unit division: 0

## 2018-08-21 MED ORDER — IBUPROFEN 800 MG PO TABS: 800.0000 mg | ORAL_TABLET | Freq: Three times a day (TID) | ORAL | 0 refills | Status: DC

## 2018-08-21 MED ORDER — OXYCODONE HCL 5 MG PO TABS: 5.0000 mg | ORAL_TABLET | Freq: Four times a day (QID) | ORAL | 0 refills | Status: DC | PRN

## 2018-08-21 NOTE — Progress Notes (Signed)
POSTOPERATIVE DAY # 2 S/P CS-repeat  S:         Reports feeling well - wants to go home this morning             Tolerating po intake / no nausea / no vomiting / + flatus / no BM             Bleeding is light             Pain controlled with Motrin and oxycodone             Up ad lib / ambulatory/ voiding QS  Newborn Breast   O:  VS: BP 104/70 (BP Location: Right Arm)   Pulse 68   Temp 98.6 F (37 C) (Oral)   Resp 16   Ht 5\' 8"  (1.727 m)   Wt 71.3 kg   SpO2 99%   Breastfeeding Unknown   BMI 23.89 kg/m   LABS:              Recent Labs    08/20/18 0545  WBC 12.9*  HGB 10.8*  PLT 191               Bloodtype: --/--/O NEG (02/04 0545) / newborn O positive - rhogam ordered  Rubella: Immune (07/17 0000)                                            I&O: Intake/Output      02/04 0701 - 02/05 0700 02/05 0701 - 02/06 0700   P.O.     I.V. (mL/kg)     Total Intake(mL/kg)     Urine (mL/kg/hr) 875 (0.5)    Blood     Total Output 875    Net -875                    Physical Exam:             Alert and Oriented X3  Lungs: Clear and unlabored  Heart: regular rate and rhythm / no mumurs  Abdomen: soft, non-tender, non-distended              Fundus: firm, non-tender, U-1             Dressing itnact              Incision:  approximated with suture / no erythema / no ecchymosis / no drainage  Perineum: intact  Lochia: light  Extremities: no edema, no calf pain or tenderness, negative Homans  A:        POD # 2 S/P CS-repeat             P:        Routine postoperative care              DC home   Marlinda Mike CNM, MSN, [facility] 08/21/2018, 8:12 AM

## 2018-08-21 NOTE — Lactation Note (Signed)
This note was copied from a baby's chart. Lactation Consultation Note  Patient Name: Brianna Hess APOLI'D Date: 08/21/2018 Reason for consult: Follow-up assessment;Term  P2 mom who successfully breastfed her first child for 15 months without any issues. Term baby @ 45hrs with 5% wt loss. Mom reports comfort with latch and knows how to break seal and latch again when necessary. Reviewed basics with mom re: changing positions, alternating breasts, feeding with cues.  Mom has Medela DEBP at home, plans to stay home for 2 months before returning to work.  Mom denies any issues with supply with first child. Mom does report having to use a shield at first with her first child, but so far not required with this baby.  Encouraged mom to call for OP consult if she introduces a shield or has any concerns. Reviewed engorgement prevention and treatment, and milk storage guidelines. Reviewed outpatient lactation services, breastfeeding support group, and lactation phone number.    Maternal Data Does the patient have breastfeeding experience prior to this delivery?: Yes   Interventions Interventions: Breast feeding basics reviewed;Skin to skin;Adjust position;Position options;Expressed milk;Hand pump;DEBP;Ice   Consult Status Consult Status: Complete Date: 08/21/18 Follow-up type: Call as needed    [name] 08/21/2018, 9:11 AM

## 2018-08-21 NOTE — Discharge Summary (Signed)
OB Discharge Summary  Patient Name: Brianna Hess DOB: [birth year] MRN: [id number]  Date of admission: 08/19/2018  Admitting diagnosis: Previous Cesarean Section Intrauterine pregnancy: [redacted]w[redacted]d      Date of discharge: 08/21/2018    Discharge diagnosis: Term Pregnancy Delivered and POD #2 s/p Repeat CS      Prenatal history: G2P1002   EDC : 08/25/2018, by Other Basis  Prenatal care at [facility]  Primary provider : Mody Prenatal course complicated by previous CS  Prenatal Labs: ABO, Rh: --/--/O NEG (02/04 0545) / Rhophylac given antepartum and poostpartum Antibody: NEG (02/04 0545) Rubella: Immune (07/17 0000)  RPR: Non Reactive (01/31 0858)  HBsAg: Negative (07/17 0000)  HIV: Non-reactive (07/17 0000)                                 Hospital course:  Sceduled C/S   33 y.o. yo G2P1002 at [redacted]w[redacted]d was admitted to the hospital 08/19/2018 for scheduled cesarean section with the following indication:Elective Repeat. Membrane Rupture Time/Date: 12:06 PM ,08/19/2018  Patient delivered a Viable infant.08/19/2018 Details of operation can be found in separate operative note.    Pateint had an uncomplicated postpartum course.  She is ambulating, tolerating a regular diet, passing flatus, and urinating well. Patient is discharged home in stable condition on  08/21/18 on POD #2.         Delivering PROVIDER: MODY, VAISHALI                                                            Complications: None  Newborn Data: Live born female  Birth Weight: 7 lb 12.2 oz (3520 g) APGAR: 8, 8  Newborn Delivery   Birth date/time:  08/19/2018 12:07:00 Delivery type:  C-Section, Low Transverse Trial of labor:  No C-section categorization:  Repeat     Baby Feeding: Breast Disposition:home with mother  Post partum procedures:rhogam  Labs: Lab Results  Component Value Date   WBC 12.9 (H) 08/20/2018   HGB 10.8 (L) 08/20/2018   HCT 32.3 (L) 08/20/2018   MCV 94.4 08/20/2018   PLT  191 08/20/2018   No flowsheet data found.    Physical Exam @ time of discharge:  Vitals:   08/20/18 1050 08/20/18 1420 08/20/18 2202 08/21/18 0546  BP: 106/69 112/66 (!) 94/57 104/70  Pulse: 67 71 63 68  Resp: 18 16 17 16   Temp:  97.9 F (36.6 C) 97.9 F (36.6 C) 98.6 F (37 C)  TempSrc:  Oral Oral Oral  SpO2: 99%  97% 99%  Weight:      Height:        General: alert, cooperative and no distress Lochia: appropriate Uterine Fundus: firm Perineum: intact Incision: Healing well with no significant drainage Extremities: DVT Evaluation: No evidence of DVT seen on physical exam.   Discharge instructions:  "Baby and Me Booklet" and [facility] Booklet  Discharge Medications:  Allergies as of 08/21/2018   No Known Allergies     Medication List    TAKE these medications   cetirizine 10 MG tablet Commonly known as:  ZYRTEC Take 10 mg by mouth daily.   docusate sodium 100 MG capsule Commonly known as:  COLACE Take 100  mg by mouth daily.   ibuprofen 800 MG tablet Commonly known as:  ADVIL,MOTRIN Take 1 tablet (800 mg total) by mouth every 8 (eight) hours.   Magnesium Oxide 250 MG Tabs Take 250 mg by mouth daily.   oxyCODONE 5 MG immediate release tablet Commonly known as:  Oxy IR/ROXICODONE Take 1 tablet (5 mg total) by mouth every 6 (six) hours as needed for moderate pain.   PRENATAL+DHA PO Take 1 tablet by mouth daily.            Discharge Care Instructions  (From admission, onward)         Start     Ordered   08/21/18 0000  Discharge wound care:    Comments:  Leave honeycomb in place for 5 days - remove if get wet in shower. Leave steri-strips in place x 2 weeks. Keep incision clean and dry   08/21/18 0853          Diet: routine diet  Activity: Advance as tolerated. Pelvic rest x 6 weeks.   Follow up:6 weeks    Signed: Marlinda Mike CNM, MSN, [facility] 08/21/2018, 8:53 AM

## 2018-12-31 ENCOUNTER — Encounter: Payer: Self-pay | Admitting: Internal Medicine

## 2018-12-31 ENCOUNTER — Telehealth: Payer: Self-pay | Admitting: Internal Medicine

## 2018-12-31 ENCOUNTER — Telehealth: Payer: Self-pay | Admitting: *Deleted

## 2018-12-31 DIAGNOSIS — J069 Acute upper respiratory infection, unspecified: Secondary | ICD-10-CM | POA: Diagnosis not present

## 2018-12-31 DIAGNOSIS — Z20822 Contact with and (suspected) exposure to covid-19: Secondary | ICD-10-CM

## 2018-12-31 NOTE — Addendum Note (Signed)
Addended by: Matilde Sprang on: 12/31/2018 05:23 PM   Modules accepted: Orders

## 2018-12-31 NOTE — Telephone Encounter (Signed)
Left message for pt to call back for covid testing. Requested by Dr. Tedra Senegal for both she and her husband, 'Tanner.' States both "Symptomatic."  Pts CB# [phone]  Practices CB# [phone] Fax# [phone]

## 2018-12-31 NOTE — Telephone Encounter (Signed)
Patient's husband returned call and patient listening via speaker phone, appointment scheduled for tomorrow, 01/01/19 at [facility], advised of location and to wear a mask for everyone in the vehicle, she verbalized understanding. Orders placed.

## 2018-12-31 NOTE — Telephone Encounter (Signed)
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 Her husband is a patient here. Her daughter is in daycare and she came home with URI symptoms. Husband has URI symptoms as well.He cannot return to work until he is tested. He has no fever and no chills. Daughter cannot return to daycare until she is tested for Covid 19. Pt would like to be tested as well as she has slight URI symptoms.  Will contact [place] to arrange time for testing. Pt will schedule OV / PE at a later time in the future.  Spoke with pt today. She is agreeable to this and wishes to establish here as a patient.

## 2019-01-01 ENCOUNTER — Other Ambulatory Visit: Payer: Federal, State, Local not specified - PPO

## 2019-01-01 DIAGNOSIS — R6889 Other general symptoms and signs: Secondary | ICD-10-CM | POA: Diagnosis not present

## 2019-01-01 DIAGNOSIS — Z20822 Contact with and (suspected) exposure to covid-19: Secondary | ICD-10-CM

## 2019-01-03 ENCOUNTER — Telehealth: Payer: Self-pay

## 2019-01-03 NOTE — Telephone Encounter (Signed)
Calling to check on COVID 19 test. No results yet.

## 2019-01-04 LAB — NOVEL CORONAVIRUS, NAA: SARS-CoV-2, NAA: NOT DETECTED

## 2019-02-12 DIAGNOSIS — L820 Inflamed seborrheic keratosis: Secondary | ICD-10-CM | POA: Diagnosis not present

## 2019-02-12 DIAGNOSIS — Z789 Other specified health status: Secondary | ICD-10-CM | POA: Diagnosis not present

## 2019-02-12 DIAGNOSIS — D2221 Melanocytic nevi of right ear and external auricular canal: Secondary | ICD-10-CM | POA: Diagnosis not present

## 2019-02-12 DIAGNOSIS — D2272 Melanocytic nevi of left lower limb, including hip: Secondary | ICD-10-CM | POA: Diagnosis not present

## 2019-02-12 DIAGNOSIS — D485 Neoplasm of uncertain behavior of skin: Secondary | ICD-10-CM | POA: Diagnosis not present

## 2019-02-12 DIAGNOSIS — D224 Melanocytic nevi of scalp and neck: Secondary | ICD-10-CM | POA: Diagnosis not present

## 2019-02-12 DIAGNOSIS — L538 Other specified erythematous conditions: Secondary | ICD-10-CM | POA: Diagnosis not present

## 2019-02-12 DIAGNOSIS — L700 Acne vulgaris: Secondary | ICD-10-CM | POA: Diagnosis not present

## 2019-03-25 DIAGNOSIS — H10413 Chronic giant papillary conjunctivitis, bilateral: Secondary | ICD-10-CM | POA: Diagnosis not present

## 2019-03-25 DIAGNOSIS — H52203 Unspecified astigmatism, bilateral: Secondary | ICD-10-CM | POA: Diagnosis not present

## 2019-03-25 DIAGNOSIS — H5213 Myopia, bilateral: Secondary | ICD-10-CM | POA: Diagnosis not present

## 2019-04-01 ENCOUNTER — Other Ambulatory Visit: Payer: Federal, State, Local not specified - PPO | Admitting: Internal Medicine

## 2019-04-01 ENCOUNTER — Other Ambulatory Visit: Payer: Self-pay

## 2019-04-01 DIAGNOSIS — Z0000 Encounter for general adult medical examination without abnormal findings: Secondary | ICD-10-CM

## 2019-04-01 DIAGNOSIS — Z1321 Encounter for screening for nutritional disorder: Secondary | ICD-10-CM

## 2019-04-01 DIAGNOSIS — Z13220 Encounter for screening for lipoid disorders: Secondary | ICD-10-CM | POA: Diagnosis not present

## 2019-04-01 DIAGNOSIS — Z1329 Encounter for screening for other suspected endocrine disorder: Secondary | ICD-10-CM | POA: Diagnosis not present

## 2019-04-01 NOTE — Addendum Note (Signed)
Addended by: Mady Haagensen on: 04/01/2019 09:26 AM   Modules accepted: Orders

## 2019-04-02 LAB — CBC WITH DIFFERENTIAL/PLATELET
Absolute Monocytes: 338 cells/uL (ref 200–950)
Basophils Absolute: 10 cells/uL (ref 0–200)
Basophils Relative: 0.2 %
Eosinophils Absolute: 68 cells/uL (ref 15–500)
Eosinophils Relative: 1.3 %
HCT: 41.1 % (ref 35.0–45.0)
Hemoglobin: 13.5 g/dL (ref 11.7–15.5)
Lymphs Abs: 2148 cells/uL (ref 850–3900)
MCH: 30.5 pg (ref 27.0–33.0)
MCHC: 32.8 g/dL (ref 32.0–36.0)
MCV: 93 fL (ref 80.0–100.0)
MPV: 10.8 fL (ref 7.5–12.5)
Monocytes Relative: 6.5 %
Neutro Abs: 2636 cells/uL (ref 1500–7800)
Neutrophils Relative %: 50.7 %
Platelets: 224 10*3/uL (ref 140–400)
RBC: 4.42 10*6/uL (ref 3.80–5.10)
RDW: 12.5 % (ref 11.0–15.0)
Total Lymphocyte: 41.3 %
WBC: 5.2 10*3/uL (ref 3.8–10.8)

## 2019-04-02 LAB — COMPLETE METABOLIC PANEL WITH GFR
AG Ratio: 1.7 (calc) (ref 1.0–2.5)
ALT: 15 U/L (ref 6–29)
AST: 17 U/L (ref 10–30)
Albumin: 4.2 g/dL (ref 3.6–5.1)
Alkaline phosphatase (APISO): 76 U/L (ref 31–125)
BUN: 11 mg/dL (ref 7–25)
CO2: 28 mmol/L (ref 20–32)
Calcium: 9.1 mg/dL (ref 8.6–10.2)
Chloride: 104 mmol/L (ref 98–110)
Creat: 0.75 mg/dL (ref 0.50–1.10)
GFR, Est African American: 120 mL/min/{1.73_m2} (ref >=60)
GFR, Est Non African American: 103 mL/min/{1.73_m2} (ref >=60)
Globulin: 2.5 g/dL (calc) (ref 1.9–3.7)
Glucose, Bld: 79 mg/dL (ref 65–99)
Potassium: 4.3 mmol/L (ref 3.5–5.3)
Sodium: 140 mmol/L (ref 135–146)
Total Bilirubin: 0.6 mg/dL (ref 0.2–1.2)
Total Protein: 6.7 g/dL (ref 6.1–8.1)

## 2019-04-02 LAB — LIPID PANEL
Cholesterol: 178 mg/dL (ref <200)
HDL: 56 mg/dL (ref >=50)
LDL Cholesterol (Calc): 108 mg/dL (calc) — ABNORMAL HIGH
Non-HDL Cholesterol (Calc): 122 mg/dL (calc) (ref <130)
Total CHOL/HDL Ratio: 3.2 (calc) (ref <5.0)
Triglycerides: 46 mg/dL (ref <150)

## 2019-04-02 LAB — TSH: TSH: 1.36 mIU/L

## 2019-04-08 ENCOUNTER — Other Ambulatory Visit: Payer: Self-pay

## 2019-04-08 ENCOUNTER — Ambulatory Visit (INDEPENDENT_AMBULATORY_CARE_PROVIDER_SITE_OTHER): Payer: Federal, State, Local not specified - PPO | Admitting: Internal Medicine

## 2019-04-08 ENCOUNTER — Encounter: Payer: Self-pay | Admitting: Internal Medicine

## 2019-04-08 VITALS — BP 120/80 | HR 71 | Ht 68.0 in | Wt 122.0 lb

## 2019-04-08 DIAGNOSIS — R0683 Snoring: Secondary | ICD-10-CM

## 2019-04-08 DIAGNOSIS — J3089 Other allergic rhinitis: Secondary | ICD-10-CM

## 2019-04-08 DIAGNOSIS — Z0000 Encounter for general adult medical examination without abnormal findings: Secondary | ICD-10-CM

## 2019-04-08 LAB — POCT URINALYSIS DIPSTICK
Appearance: NEGATIVE
Bilirubin, UA: NEGATIVE
Blood, UA: NEGATIVE
Glucose, UA: NEGATIVE
Ketones, UA: NEGATIVE
Leukocytes, UA: NEGATIVE
Nitrite, UA: NEGATIVE
Odor: NEGATIVE
Protein, UA: NEGATIVE
Spec Grav, UA: 1.01 (ref 1.010–1.025)
Urobilinogen, UA: 0.2 E.U./dL
pH, UA: 6.5 (ref 5.0–8.0)

## 2019-04-08 NOTE — Progress Notes (Signed)
   Subjective:    Patient ID: Brianna Decamp, PA, female    DOB: [birth year], 33 y.o.   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  Social history: She is married.  Husband is employed as an Forensic psychologist with [place] [employer].  She has 2 daughters and 60-year-old a 60-monthold.  Patient works full-time.  She has a mScientist, water quality  Does not smoke.  Social alcohol consumption- not frequent.  Enjoys biking and running.  Family history: Father age 4050with sarcoidosis.  Mother age 4031with no significant medical problems.  One sister age 2763with history of Hashimoto's thyroiditis.  No brothers.  Dr. MGenia Haroldis her GYN physician.  Past medical history: C-section 2017 and 2020.  2 pregnancies and no miscarriages.  History of allergic rhinitis and takes Zyrtec 10 mg daily.  Also takes prenatal vitamins and DHA.  No known drug allergies  No history of serious illnesses or accidents.  General health is excellent.  No known drug allergies.  Had tetanus immunization in November 2019 he gets annual flu vaccine through employment.       Review of Systems has dysmenorrhea and postpartum had some hair loss.  History of headaches.     Objective:   Physical Exam Blood pressure 120/80, pulse 71, pulse oximetry 97%, weight 122 pounds BMI 18.55 height 5 feet 8 inches  Skin warm and dry.  Nodes none.  HEENT exam: TMs and pharynx are clear.  Neck is supple.  No thyromegaly.  No adenopathy.  Chest is clear to auscultation without rales or wheezing.  Cardiac exam regular rate and rhythm normal S1 and S2 without murmurs.  Breast without masses.  Abdomen soft nondistended without hepatosplenomegaly masses or tenderness.  GYN exam is deferred to Dr. MBenjie Karvonen  Extremities without deformity.  Neuro no focal deficits on brief neurological exam.  Affect, thought process and judgment are all normal.  She is  pleasant.       Assessment & Plan:  Normal health maintenance exam  History of allergic rhinitis treated with Zyrtec  Labs reviewed and she has a very mild elevation of LDL of 108.  HDL is 56, triglycerides 46 and total cholesterol of 178.  TSH CBC and C met are normal.  Dipstick urinalysis is normal.  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  Continue Zyrtec.  Return in 1 to 2 years or as needed.  She can see GYN yearly.  Husband says she is snoring and she will be referred to pulmonologist for consideration of a sleep study.

## 2019-04-16 ENCOUNTER — Encounter: Payer: Self-pay | Admitting: Internal Medicine

## 2019-04-16 DIAGNOSIS — J309 Allergic rhinitis, unspecified: Secondary | ICD-10-CM | POA: Insufficient documentation

## 2019-04-16 NOTE — Patient Instructions (Signed)
It was a pleasure to see you today.  Please call if you have any problems or questions.  Return in 1 to 2 years or as needed.

## 2019-04-23 DIAGNOSIS — L700 Acne vulgaris: Secondary | ICD-10-CM | POA: Diagnosis not present

## 2019-07-31 ENCOUNTER — Encounter: Payer: Self-pay | Admitting: Internal Medicine

## 2019-07-31 ENCOUNTER — Ambulatory Visit (INDEPENDENT_AMBULATORY_CARE_PROVIDER_SITE_OTHER): Payer: Federal, State, Local not specified - PPO | Admitting: Internal Medicine

## 2019-07-31 ENCOUNTER — Other Ambulatory Visit: Payer: Self-pay

## 2019-07-31 VITALS — BP 118/70 | HR 67 | Temp 97.9°F | Ht 68.0 in | Wt 122.6 lb

## 2019-07-31 DIAGNOSIS — R0683 Snoring: Secondary | ICD-10-CM | POA: Insufficient documentation

## 2019-07-31 NOTE — Patient Instructions (Signed)
Order- schedule HST    Dx snoring  Please call me about 2 weeks after your sleep study to see if results and recommendations are ready yet. If appropriate, we may be able o start treatment before we see you next.  Please call if we can help

## 2019-07-31 NOTE — Assessment & Plan Note (Signed)
Disruptive snoring with question of OSA. Appropriate discussion. Discussed potential treatments initially. Plan- HST

## 2019-07-31 NOTE — Progress Notes (Signed)
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 Medical problem list includes Allergic Rhnitis Husband c/o her snoring and questions apnea. He wears ear plugs. 2 small children at home. Body weight today 122 lbs.  Epworth score 13 Nasal strips haven't helped. No hx ENT surgery, heart. Lung, thyroid or seizure disorder. Nursing youngest, but little waso at night.  Drowsy a lot. 1.5 cups coffee in AM. Daily zyrtec for perennial nasal congestion. No problems reported with C-sections 2017 and 2020.  Prior to Admission medications   Medication Sig Start Date End Date Taking? Authorizing Provider  ACZONE 7.5 % GEL DIVIDE A PEA SIZE AMOUNT TO FOUR QUADRANTS OF THE FACE EVERY DAY IN THE MORNING. RUB INTO SKIN AVOIDING EYE AREA 02/21/19  Yes [provider]  Adapalene 0.3 % gel DIVIDE A pea size TO THE FOUR quadrants OF THE FACE AND RUB into SKIN avoiding EYE AREA nightly. WASH off IN THE morning.] 02/21/19  Yes [provider]  cetirizine (ZYRTEC) 10 MG tablet Take 10 mg by mouth daily.    Yes [provider]  Prenatal MV-Min-Fe Fum-FA-DHA (PRENATAL+DHA PO) Take 1 tablet by mouth daily.   Yes [provider]   No past medical history on file. Past Surgical History:  Procedure Laterality Date  . CESAREAN SECTION    . CESAREAN SECTION N/A 08/19/2018   Procedure: Repeat CESAREAN SECTION;  Surgeon: Shea Evans, MD;  Location: [facility];  Service: Obstetrics;  Laterality: N/A;  EDD: 08/25/18  . CESAREAN SECTION CLASSICAL    . DILATION AND CURETTAGE OF UTERUS     uterine polyps  . TONSILLECTOMY     No past medical history on file.   Family History  Problem Relation Age of Onset  . Healthy Mother   . Sarcoidosis Father   . Hashimoto's thyroiditis Sister    Social History   Socioeconomic History  . Marital status: Married    Spouse name: Not on file  . Number of children: Not on file  . Years of education: Not on file  . Highest  education level: Not on file  Occupational History  . Not on file  Tobacco Use  . Smoking status: Never Smoker  . Smokeless tobacco: Never Used  Substance and Sexual Activity  . Alcohol use: Yes  . Drug use: Never  . Sexual activity: Not on file  Other Topics Concern  . Not on file  Social History Narrative  . Not on file   Social Determinants of Health   Financial Resource Strain: Low Risk   . Difficulty of Paying Living Expenses: Not hard at all  Food Insecurity: No Food Insecurity  . Worried About Programme researcher, broadcasting/film/video in the Last Year: Never true  . Ran Out of Food in the Last Year: Never true  Transportation Needs: Unknown  . Lack of Transportation (Medical): No  . Lack of Transportation (Non-Medical): Not on file  Physical Activity:   . Days of Exercise per Week: Not on file  . Minutes of Exercise per Session: Not on file  Stress: Stress Concern Present  . Feeling of Stress : To some extent  Social Connections:   . Frequency of Communication with Friends and Family: Not on file  . Frequency of Social Gatherings with Friends and Family: Not on file  . Attends Religious Services: Not on file  . Active Member of Clubs or Organizations: Not on file  . Attends Banker Meetings: Not on  file  . Marital Status: Not on file  Intimate Partner Violence: Not At Risk  . Fear of Current or Ex-Partner: No  . Emotionally Abused: No  . Physically Abused: No  . Sexually Abused: No   ROS-see HPI  + = positive Constitutional:    weight loss, night sweats, fevers, chills, +fatigue, lassitude. HEENT:    +headaches, difficulty swallowing, tooth/dental problems, sore throat sneezing, itching, ear ache, +nasal congestion, post nasal drip, snoring CV:    chest pain, orthopnea, PND, swelling in lower extremities, anasarca,                                  dizziness, palpitations Resp:   shortness of breath with exertion or at rest.                productive cough,    non-productive cough, coughing up of blood.              change in color of mucus.  wheezing.   Skin:    rash or lesions. GI:  No-   heartburn, indigestion, abdominal pain, nausea, vomiting, diarrhea,                 change in bowel habits, loss of appetite GU: dysuria, change in color of urine, no urgency or frequency.   flank pain. MS:   joint pain, stiffness, decreased range of motion, back pain. Neuro-     nothing unusual Psych:  change in mood or affect.  depression or anxiety.   memory loss.  OBJ- Physical Exam   Pleasant lady General- Alert, Oriented, Affect-appropriate, Distress- none acute, + tall, +lean Skin- rash-none, lesions- none, excoriation- none Lymphadenopathy- none Head- atraumatic            Eyes- Gross vision intact, PERRLA, conjunctivae and secretions clear            Ears- Hearing, canals-normal            Nose- Clear, no-Septal dev, mucus, polyps, erosion, perforation             Throat- Mallampati II - thin, posteriorly placed soft palate, mucosa clear , drainage- none, tonsils- atrophic Neck- flexible , trachea midline, no stridor , thyroid nl, carotid no bruit Chest - symmetrical excursion , unlabored           Heart/CV- RRR , no murmur , no gallop  , no rub, nl s1 s2                           - JVD- none , edema- none, stasis changes- none, varices- none           Lung- clear to P&A, wheeze- none, cough- none , dullness-none, rub- none           Chest wall-  Abd-  Br/ Gen/ Rectal- Not done, not indicated Extrem- cyanosis- none, clubbing, none, atrophy- none, strength- nl Neuro- grossly intact to observation

## 2019-09-02 ENCOUNTER — Ambulatory Visit: Payer: Federal, State, Local not specified - PPO

## 2019-09-02 ENCOUNTER — Other Ambulatory Visit: Payer: Self-pay

## 2019-09-02 DIAGNOSIS — R0683 Snoring: Secondary | ICD-10-CM

## 2019-09-02 DIAGNOSIS — G4733 Obstructive sleep apnea (adult) (pediatric): Secondary | ICD-10-CM | POA: Diagnosis not present

## 2019-09-03 DIAGNOSIS — G4733 Obstructive sleep apnea (adult) (pediatric): Secondary | ICD-10-CM | POA: Diagnosis not present

## 2019-10-30 ENCOUNTER — Encounter: Payer: Self-pay | Admitting: Internal Medicine

## 2019-10-30 ENCOUNTER — Ambulatory Visit: Payer: Federal, State, Local not specified - PPO | Admitting: Internal Medicine

## 2019-10-30 ENCOUNTER — Other Ambulatory Visit: Payer: Self-pay

## 2019-10-30 DIAGNOSIS — R0683 Snoring: Secondary | ICD-10-CM | POA: Diagnosis not present

## 2019-10-30 DIAGNOSIS — J3089 Other allergic rhinitis: Secondary | ICD-10-CM

## 2019-10-30 NOTE — Progress Notes (Signed)
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 Medical problem list includes Allergic Rhnitis Husband c/o her snoring and questions apnea. He wears ear plugs. 2 small children at home. Body weight today 122 lbs.  Epworth score 13 Nasal strips haven't helped. No hx ENT surgery, heart. Lung, thyroid or seizure disorder. Nursing youngest, but little waso at night.  Drowsy a lot. 1.5 cups coffee in AM. Daily zyrtec for perennial nasal congestion. No problems reported with C-sections 2017 and 2020.  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  Discussed conservative measures for snoring incl sleep off back, ear plugs for husband. She is not overweight.. Consider relation to known allergic rhinitis, at least seasonally.    ROS-see HPI  + = positive Constitutional:    weight loss, night sweats, fevers, chills, +fatigue, lassitude. HEENT:    +headaches, difficulty swallowing, tooth/dental problems, sore throat sneezing, itching, ear ache, +nasal congestion, post nasal drip, snoring CV:    chest pain, orthopnea, PND, swelling in lower extremities, anasarca,                                  dizziness, palpitations Resp:   shortness of breath with exertion or at rest.                productive cough,   non-productive cough, coughing up of blood.              change in color of mucus.  wheezing.   Skin:    rash or lesions. GI:  No-   heartburn, indigestion, abdominal pain, nausea, vomiting, diarrhea,                 change in bowel habits, loss of appetite GU: dysuria, change in color of  urine, no urgency or frequency.   flank pain. MS:   joint pain, stiffness, decreased range of motion, back pain. Neuro-     nothing unusual Psych:  change in mood or affect.  depression or anxiety.   memory loss.  OBJ- Physical Exam   Pleasant lady General- Alert, Oriented, Affect-appropriate, Distress- none acute, + tall, +lean Skin- rash-none, lesions- none, excoriation- none Lymphadenopathy- none Head- atraumatic            Eyes- Gross vision intact, PERRLA, conjunctivae and secretions clear            Ears- Hearing, canals-normal            Nose- Clear, no-Septal dev, mucus, polyps, erosion, perforation             Throat- Mallampati II - thin, posteriorly placed soft palate, mucosa clear , drainage- none, tonsils- atrophic Neck- flexible , trachea midline, no stridor , thyroid nl, carotid no bruit Chest - symmetrical excursion , unlabored           Heart/CV- RRR , no murmur , no gallop  , no rub, nl s1 s2                           - JVD- none , edema- none, stasis  changes- none, varices- none           Lung- clear to P&A, wheeze- none, cough- none , dullness-none, rub- none           Chest wall-  Abd-  Br/ Gen/ Rectal- Not done, not indicated Extrem- cyanosis- none, clubbing, none, atrophy- none, strength- nl Neuro- grossly intact to observation

## 2019-10-30 NOTE — Patient Instructions (Signed)
We discussed conservative measures for snoring, and you can talk to an ENT surgeon if desired. Dr Jenne Pane and Dr Pollyann Kennedy have been interested in this.   Please call if we can help

## 2019-11-27 ENCOUNTER — Telehealth: Payer: Self-pay | Admitting: Internal Medicine

## 2019-11-27 NOTE — Telephone Encounter (Signed)
Called patient offered appt for next Tuesday patient said Friday would be better since it was her day off.

## 2019-11-27 NOTE — Telephone Encounter (Signed)
Pt wants to schedule an appt wether it be in office or virtual, its regarding stomach pain in left upper quadrant, its been going on for several months, she said its not constant pain but mainly bothers her if she bumps it or pushes into it

## 2019-11-27 NOTE — Telephone Encounter (Signed)
Virtual not appropriate. See next week here

## 2019-11-29 NOTE — Assessment & Plan Note (Signed)
Suggested adding flonase or cromolyn

## 2019-11-29 NOTE — Assessment & Plan Note (Addendum)
Primary snoring without OSA Plan- sleep off back, ? earplugs for husband. ENT eval possibly.

## 2019-12-02 ENCOUNTER — Ambulatory Visit: Payer: Federal, State, Local not specified - PPO | Admitting: Internal Medicine

## 2019-12-03 ENCOUNTER — Encounter: Payer: Self-pay | Admitting: Internal Medicine

## 2019-12-03 ENCOUNTER — Encounter: Payer: Self-pay | Admitting: Physician Assistant

## 2019-12-03 ENCOUNTER — Ambulatory Visit (INDEPENDENT_AMBULATORY_CARE_PROVIDER_SITE_OTHER): Payer: Federal, State, Local not specified - PPO | Admitting: Internal Medicine

## 2019-12-03 ENCOUNTER — Other Ambulatory Visit: Payer: Self-pay

## 2019-12-03 VITALS — BP 110/80 | HR 85 | Temp 97.7°F | Ht 68.0 in | Wt 126.0 lb

## 2019-12-03 DIAGNOSIS — R109 Unspecified abdominal pain: Secondary | ICD-10-CM | POA: Diagnosis not present

## 2019-12-03 DIAGNOSIS — R1012 Left upper quadrant pain: Secondary | ICD-10-CM | POA: Diagnosis not present

## 2019-12-04 LAB — CBC WITH DIFFERENTIAL/PLATELET
Absolute Monocytes: 402 cells/uL (ref 200–950)
Basophils Absolute: 20 cells/uL (ref 0–200)
Basophils Relative: 0.3 %
Eosinophils Absolute: 40 cells/uL (ref 15–500)
Eosinophils Relative: 0.6 %
HCT: 42.7 % (ref 35.0–45.0)
Hemoglobin: 14.2 g/dL (ref 11.7–15.5)
Lymphs Abs: 2734 cells/uL (ref 850–3900)
MCH: 31 pg (ref 27.0–33.0)
MCHC: 33.3 g/dL (ref 32.0–36.0)
MCV: 93.2 fL (ref 80.0–100.0)
MPV: 10.8 fL (ref 7.5–12.5)
Monocytes Relative: 6 %
Neutro Abs: 3504 cells/uL (ref 1500–7800)
Neutrophils Relative %: 52.3 %
Platelets: 272 10*3/uL (ref 140–400)
RBC: 4.58 10*6/uL (ref 3.80–5.10)
RDW: 12.3 % (ref 11.0–15.0)
Total Lymphocyte: 40.8 %
WBC: 6.7 10*3/uL (ref 3.8–10.8)

## 2019-12-04 LAB — HEPATIC FUNCTION PANEL
AG Ratio: 1.7 (calc) (ref 1.0–2.5)
ALT: 11 U/L (ref 6–29)
AST: 16 U/L (ref 10–30)
Albumin: 4.7 g/dL (ref 3.6–5.1)
Alkaline phosphatase (APISO): 60 U/L (ref 31–125)
Bilirubin, Direct: 0.2 mg/dL (ref 0.0–0.2)
Globulin: 2.8 g/dL (calc) (ref 1.9–3.7)
Indirect Bilirubin: 1 mg/dL (calc) (ref 0.2–1.2)
Total Bilirubin: 1.2 mg/dL (ref 0.2–1.2)
Total Protein: 7.5 g/dL (ref 6.1–8.1)

## 2019-12-05 ENCOUNTER — Ambulatory Visit: Payer: Federal, State, Local not specified - PPO | Admitting: Internal Medicine

## 2019-12-06 NOTE — Patient Instructions (Addendum)
CBC and liver functions are normal.  Referral to Gastroenterology for evaluation of left upper quadrant abdominal pain.

## 2019-12-06 NOTE — Progress Notes (Signed)
   Subjective:    Patient ID: Jillene Bucks, PA, female    DOB: 1985-11-05, 34 y.o.   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  General health is excellent.  Patient saw Dr. Fannie Knee in April for snoring.  Felt to have nonseasonal allergic rhinitis.  Suggested adding Flonase or cromolyn sodium.  No obstructive sleep apnea found.  Did desaturate to 93% diagnosed with primary snoring without obstructive sleep apnea.  Takes Zyrtec.   History of C-sections 2017 in 2020.  Does not smoke.  BMI is 19.16.  Left upper quadrant pain has patient concerned.  She is worried about pancreatic cancer but that would be an unusual presentation, I believe.  No melena or hematemesis.  No family history of pancreatic cancer.  Father with history of sarcoidosis.  1 sister with history of Hashimoto's thyroiditis.  No brothers.  Mother in good health with no significant medical problems.  Social history: She is married.  Husband is an attorney with the [place] [employer].  She has 2 daughters.  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  She has a Event organiser.  Enjoys biking and running.  Review of Systems no nausea or vomiting.  Pain has been intermittent and rather pronounced in the left upper quadrant patient says and has been going on for a few weeks.  Denies overexertion/musculoskeletal strain or situational stress other than everyday life during the pandemic     Objective:   Physical Exam Blood pressure 110/80, pulse 85 temperature 97.7 degrees pulse oximetry 98% weight 126 pounds BMI one 9.16 CBC with differential and liver functions are normal. Abdomen soft nondistended without hepatosplenomegaly masses or tenderness.     Assessment & Plan:  Left upper quadrant abdominal pain-intermittent and sharp-etiology unclear but no splenomegaly appreciated to my exam.  Suspect this could  well be musculoskeletal pain.  Doubt pancreatic carcinoma.  Plan: Patient would like to have abdominal CT but I do not think I will be able to get that approved through insurance company based on the symptoms.  We will refer her to Gastroenterology for evaluation.

## 2019-12-29 ENCOUNTER — Ambulatory Visit: Payer: Federal, State, Local not specified - PPO | Admitting: Physician Assistant

## 2019-12-29 ENCOUNTER — Other Ambulatory Visit (INDEPENDENT_AMBULATORY_CARE_PROVIDER_SITE_OTHER): Payer: Federal, State, Local not specified - PPO

## 2019-12-29 ENCOUNTER — Encounter: Payer: Self-pay | Admitting: Physician Assistant

## 2019-12-29 VITALS — BP 102/74 | HR 98 | Ht 68.0 in | Wt 126.1 lb

## 2019-12-29 DIAGNOSIS — R1012 Left upper quadrant pain: Secondary | ICD-10-CM | POA: Diagnosis not present

## 2019-12-29 LAB — BASIC METABOLIC PANEL WITH GFR
BUN: 12 mg/dL (ref 6–23)
CO2: 28 meq/L (ref 19–32)
Calcium: 9.4 mg/dL (ref 8.4–10.5)
Chloride: 100 meq/L (ref 96–112)
Creatinine, Ser: 0.73 mg/dL (ref 0.40–1.20)
GFR: 90.3 mL/min
Glucose, Bld: 83 mg/dL (ref 70–99)
Potassium: 4.2 meq/L (ref 3.5–5.1)
Sodium: 138 meq/L (ref 135–145)

## 2019-12-29 NOTE — Progress Notes (Signed)
Subjective:    Patient ID: Brianna Decamp, PA, female    DOB: Feb 24, 1986, 34 y.o.   MRN: [id number]  HPI Brianna Hess is a pleasant 34 year old white female, new to GI today, referred by Dr. Renold Genta for evaluation of left upper quadrant pain.  Patient is in generally good health with no known chronic medical problems.  She is status post C-section x2. She has no prior GI history.  She says her current symptoms have been present over the past several months and describes it as a sensation of being very tender to deep palpation in the left upper quadrant.  This discomfort or tenderness is better with lying down and less noticeable.  She says she has particularly noticed it while carrying her child and being kicked in that area.  She has not had any associated nausea or vomiting, no changes with p.o. intake, no changes in bowel habits no melena or hematochezia.  The discomfort is constant.  The only aggravating factor is palpation particularly with sitting or standing.  She does not have any chronic reflux symptoms.  She has developed some diastases recti with pregnancies.  She has not had any known injury, no history of disc disease etc. and has not noticed any radiation of this pain into the left back.  At times she says she gets a sharp pain with palpation. Recent CBC and hepatic panel were unremarkable, no abdominal imaging.  Review of Systems Pertinent positive and negative review of systems were noted in the above HPI section.  All other review of systems was otherwise negative.  Outpatient Encounter Medications as of 12/29/2019  Medication Sig  . cetirizine (ZYRTEC) 10 MG tablet Take 10 mg by mouth daily.    No facility-administered encounter medications on file as of 12/29/2019.   No Known Allergies Patient Active Problem List   Diagnosis Date Noted  . Snoring 07/31/2019  . Allergic rhinitis 04/16/2019  . Previous cesarean section 08/19/2018   Social History   Socioeconomic History  .  Marital status: Married    Spouse name: Not on file  . Number of children: Not on file  . Years of education: Not on file  . Highest education level: Not on file  Occupational History  . Not on file  Tobacco Use  . Smoking status: Never Smoker  . Smokeless tobacco: Never Used  Vaping Use  . Vaping Use: Never used  Substance and Sexual Activity  . Alcohol use: Yes    Comment: TWICE A WEEK  . Drug use: Never  . Sexual activity: Not on file  Other Topics Concern  . Not on file  Social History Narrative  . Not on file   Social Determinants of Health   Financial Resource Strain:   . Difficulty of Paying Living Expenses:   Food Insecurity:   . Worried About Charity fundraiser in the Last Year:   . Arboriculturist in the Last Year:   Transportation Needs:   . Film/video editor (Medical):   Marland Kitchen Lack of Transportation (Non-Medical):   Physical Activity:   . Days of Exercise per Week:   . Minutes of Exercise per Session:   Stress:   . Feeling of Stress :   Social Connections:   . Frequency of Communication with Friends and Family:   . Frequency of Social Gatherings with Friends and Family:   . Attends Religious Services:   . Active Member of Clubs or Organizations:   . Attends Club  or Organization Meetings:   Marland Kitchen Marital Status:   Intimate Partner Violence:   . Fear of Current or Ex-Partner:   . Emotionally Abused:   Marland Kitchen Physically Abused:   . Sexually Abused:     Brianna Hess family history includes Hashimoto's thyroiditis in her sister; Healthy in her mother; Sarcoidosis in her father.      Objective:    Vitals:   12/29/19 0830  BP: 102/74  Pulse: 98    Physical Exam Well-developed well-nourished female in no acute distress.  Height, QPRFFM384, BMI19.1  HEENT; nontraumatic normocephalic, EOMI, PE RR LA, sclera anicteric. Oropharynx; Neck; supple, no JVD Cardiovascular; regular rate and rhythm with S1-S2, no murmur rub or gallop Pulmonary; Clear  bilaterally Abdomen; soft, nontender, in supine position nondistended, no palpable mass or hepatosplenomegaly, bowel sounds are active.  In sitting position she has focal tenderness in the left upper quadrant, no definite palpable abdominal wall hernia defect. Rectal; not done today Skin; benign exam, no jaundice rash or appreciable lesions Extremities; no clubbing cyanosis or edema skin warm and dry Neuro/Psych; alert and oriented x4, grossly nonfocal mood and affect appropriate       Assessment & Plan:   #27 34 year old white female with several month history of focal left upper quadrant pain/tenderness most notable with palpation in the sitting or standing position and better with lying down. No other associated GI symptomatology. Etiology of patient's pain is not clear, concerned she may have a small ventral hernia, consider adhesions, versus muscular/soft tissue pathology  Plan; patient will be scheduled for CT scan of the abdomen and pelvis with contrast. She will be established with Dr. Marina Goodell. Further recommendations pending findings at CT.  Militza Devery Oswald Hillock PA-C 12/31/2019   Cc: Margaree Mackintosh, MD

## 2019-12-29 NOTE — Patient Instructions (Signed)
If you are age 36 or older, your body mass index should be between 23-30. Your Body mass index is 19.18 kg/m. If this is out of the aforementioned range listed, please consider follow up with your Primary Care Provider.  If you are age 64 or younger, your body mass index should be between 19-25. Your Body mass index is 19.18 kg/m. If this is out of the aformentioned range listed, please consider follow up with your Primary Care Provider.   Your provider has requested that you go to the basement level for lab work before leaving today. Press "B" on the elevator. The lab is located at the first door on the left as you exit the elevator.  Due to recent changes in healthcare laws, you may see the results of your imaging and laboratory studies on MyChart before your provider has had a chance to review them.  We understand that in some cases there may be results that are confusing or concerning to you. Not all laboratory results come back in the same time frame and the provider may be waiting for multiple results in order to interpret others.  Please give us 48 hours in order for your provider to thoroughly review all the results before contacting the office for clarification of your results.   You have been scheduled for a CT scan of the abdomen and pelvis at [facility] ([address]---this is in the same building [facility]).   You are scheduled on 12/30/19 at 10:30 am. You should arrive 15 minutes prior to your appointment time for registration. Please follow the written instructions below on the day of your exam:  WARNING: IF YOU ARE ALLERGIC TO IODINE/X-RAY DYE, PLEASE NOTIFY RADIOLOGY IMMEDIATELY AT [phone]! YOU WILL BE GIVEN A 13 HOUR PREMEDICATION PREP.  1) Do not eat or drink anything after 6:30 am (4 hours prior to your test) 2) You have been given 2 bottles of oral contrast to drink. The solution may taste better if refrigerated, but do NOT add ice or any  other liquid to this solution. Shake well before drinking.    Drink 1 bottle of contrast @ 8:30 am (2 hours prior to your exam)  Drink 1 bottle of contrast @ 9:30 am (1 hour prior to your exam)  You may take any medications as prescribed with a small amount of water, if necessary. If you take any of the following medications: METFORMIN, GLUCOPHAGE, GLUCOVANCE, AVANDAMET, RIOMET, FORTAMET, ACTOPLUS MET, JANUMET, GLUMETZA or METAGLIP, you MAY be asked to HOLD this medication 48 hours AFTER the exam.  The purpose of you drinking the oral contrast is to aid in the visualization of your intestinal tract. The contrast solution may cause some diarrhea. Depending on your individual set of symptoms, you may also receive an intravenous injection of x-ray contrast/dye. Plan on being at [facility] for 30 minutes or longer, depending on the type of exam you are having performed.  This test typically takes 30-45 minutes to complete.  If you have any questions regarding your exam or if you need to reschedule, you may call the CT department at [phone] between the hours of 8:00 am and 5:00 pm, Monday-Friday.  _______________________________________________________________________  Follow up pending the results of your CT and Labs   

## 2019-12-30 ENCOUNTER — Ambulatory Visit (INDEPENDENT_AMBULATORY_CARE_PROVIDER_SITE_OTHER)
Admission: RE | Admit: 2019-12-30 | Discharge: 2019-12-30 | Disposition: A | Discharge status: Home / Self Care | Payer: Federal, State, Local not specified - PPO | Source: Ambulatory Visit | Attending: Physician Assistant | Admitting: Physician Assistant

## 2019-12-30 ENCOUNTER — Other Ambulatory Visit: Payer: Self-pay

## 2019-12-30 DIAGNOSIS — K5900 Constipation, unspecified: Secondary | ICD-10-CM | POA: Diagnosis not present

## 2019-12-30 DIAGNOSIS — R1012 Left upper quadrant pain: Secondary | ICD-10-CM

## 2019-12-30 MED ORDER — IOHEXOL 300 MG/ML  SOLN
100.0000 mL | Freq: Once | INTRAMUSCULAR | Status: AC | PRN
  Administered 2019-12-30: 100 mL via INTRAVENOUS

## 2019-12-31 ENCOUNTER — Encounter: Payer: Self-pay | Admitting: Physician Assistant

## 2020-02-13 ENCOUNTER — Other Ambulatory Visit: Payer: Self-pay | Admitting: Nurse Practitioner

## 2020-02-13 ENCOUNTER — Other Ambulatory Visit: Payer: Self-pay | Admitting: Cardiovascular Disease

## 2020-02-13 MED ORDER — ALBUTEROL SULFATE HFA 108 (90 BASE) MCG/ACT IN AERS: 2.0000 | INHALATION_SPRAY | Freq: Four times a day (QID) | RESPIRATORY_TRACT | 2 refills | Status: AC | PRN

## 2020-02-13 MED FILL — ALBUTEROL SULFATE HFA 108 (: 108 (90 BAS | 25 days supply | Qty: 9 | Fill #0

## 2020-06-02 ENCOUNTER — Other Ambulatory Visit (HOSPITAL_COMMUNITY): Payer: Self-pay | Admitting: Internal Medicine

## 2020-06-02 ENCOUNTER — Ambulatory Visit: Payer: Federal, State, Local not specified - PPO | Attending: Internal Medicine

## 2020-06-02 DIAGNOSIS — Z23 Encounter for immunization: Secondary | ICD-10-CM

## 2020-06-02 NOTE — Progress Notes (Signed)
   Covid-19 Vaccination Clinic  Name:  Brianna Hess, Brianna Hess    MRN: [id number] DOB: 12-17-1985  06/02/2020  [name] was observed post Covid-19 immunization for 15 minutes without incident. She was provided with Vaccine Information Sheet and instruction to access the V-Safe system.   [name] was instructed to call 911 with any severe reactions post vaccine: Marland Kitchen Difficulty breathing  . Swelling of face and throat  . A fast heartbeat  . A bad rash all over body  . Dizziness and weakness   Immunizations Administered    Name Date Dose VIS Date Route   Pfizer COVID-19 Vaccine 06/02/2020 12:21 PM 0.3 mL 05/05/2020 Intramuscular   Manufacturer: [employer], [employer]   Lot: [id number]   NDC: [address]-7

## 2020-06-17 ENCOUNTER — Telehealth: Payer: Self-pay | Admitting: Internal Medicine

## 2020-06-17 NOTE — Telephone Encounter (Signed)
Pt has had a rash that has been really itchy for a week and she wanted to know if you could see her today or tomorrow. She said she doubled up on her antihistamines and it doesn't seem to be helping

## 2020-06-17 NOTE — Telephone Encounter (Signed)
Scheduled

## 2020-06-17 NOTE — Telephone Encounter (Signed)
Maybe tomorrow

## 2020-06-18 ENCOUNTER — Encounter: Payer: Self-pay | Admitting: Internal Medicine

## 2020-06-18 ENCOUNTER — Other Ambulatory Visit: Payer: Self-pay

## 2020-06-18 ENCOUNTER — Ambulatory Visit: Payer: Federal, State, Local not specified - PPO | Admitting: Internal Medicine

## 2020-06-18 VITALS — BP 110/70 | HR 78 | Temp 98.4°F | Ht 68.0 in | Wt 126.0 lb

## 2020-06-18 DIAGNOSIS — L509 Urticaria, unspecified: Secondary | ICD-10-CM

## 2020-06-18 MED ORDER — PREDNISONE 10 MG PO TABS: ORAL_TABLET | ORAL | 1 refills | Status: DC

## 2020-06-18 NOTE — Telephone Encounter (Signed)
Patient called she is home with her sick daughter and wants to know if she can do a virtual visit instead of ov? Okay to change appt?

## 2020-06-18 NOTE — Progress Notes (Signed)
   Subjective:    Patient ID: Brianna Bucks, PA, female    DOB: [birth year], 34 y.o.   MRN: [id number]  HPI 34 year old Female hear with migrating itchy rash for about a week. Has tried antihistamines without relief. Rash seems to be migrating to various places.Had Covid vaccine November 17,2021- do not think this is related.  Hx of allergic rhinitis and takes Zyrtec. No smoking history. No known food allergies.No hx of allergic rhinitis. No history of anaphylaxis. 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  Married with 2 small children.  Family Hx: Father with history of sarcoidosis.  Mother with no significant medical problems.  Sister with history of Hashimoto's thyroiditis.  No brothers.  Past medical history: 2 C-sections in 2017 and 2020.  No history of serious illnesses or accidents.  General health is excellent.  No known drug allergies.    Review of Systems see above, Does not associate this rash with any foods. No recent illness, no new meds     Objective:   Physical Exam BP 110/70 pulse 98.4 degrees, pulse 78 weight 126 pounds pulse ox 98%  Has macular erythema on arm that is itchy. No generalized rash. No SOB or wheezing. Pharynx is clear.        Assessment & Plan:  Urticaria- etiology unclear  Plan:Take Prednisone 10 mg tabs in tapering course as directed starting with 6 tabs day 1 and decreasing by one tab daily 6-5-4-3-2-1 taper. If symptoms persist, will refer to allergist.

## 2020-06-18 NOTE — Telephone Encounter (Signed)
I am sorry, I need to see the rash

## 2020-07-21 DIAGNOSIS — L501 Idiopathic urticaria: Secondary | ICD-10-CM | POA: Diagnosis not present

## 2020-07-21 DIAGNOSIS — J3089 Other allergic rhinitis: Secondary | ICD-10-CM | POA: Diagnosis not present

## 2020-07-21 DIAGNOSIS — J3081 Allergic rhinitis due to animal (cat) (dog) hair and dander: Secondary | ICD-10-CM | POA: Diagnosis not present

## 2020-07-21 DIAGNOSIS — J301 Allergic rhinitis due to pollen: Secondary | ICD-10-CM | POA: Diagnosis not present

## 2020-08-08 ENCOUNTER — Encounter: Payer: Self-pay | Admitting: Internal Medicine

## 2020-08-08 NOTE — Patient Instructions (Addendum)
Take prednisone in tapering course as directed.  Call if symptoms recur or persist.  Many times we do not find reason for urticaria.

## 2020-11-08 ENCOUNTER — Ambulatory Visit: Payer: Federal, State, Local not specified - PPO | Admitting: Internal Medicine

## 2020-11-08 ENCOUNTER — Other Ambulatory Visit: Payer: Self-pay

## 2020-11-08 ENCOUNTER — Telehealth: Payer: Self-pay | Admitting: Internal Medicine

## 2020-11-08 ENCOUNTER — Encounter: Payer: Self-pay | Admitting: Internal Medicine

## 2020-11-08 VITALS — BP 110/70 | HR 78 | Temp 98.3°F | Ht 68.0 in | Wt 122.0 lb

## 2020-11-08 DIAGNOSIS — G43011 Migraine without aura, intractable, with status migrainosus: Secondary | ICD-10-CM | POA: Diagnosis not present

## 2020-11-08 LAB — CBC WITH DIFFERENTIAL/PLATELET
Absolute Monocytes: 343 cells/uL (ref 200–950)
Basophils Absolute: 23 cells/uL (ref 0–200)
Basophils Relative: 0.3 %
Eosinophils Absolute: 8 cells/uL — ABNORMAL LOW (ref 15–500)
Eosinophils Relative: 0.1 %
HCT: 37.5 % (ref 35.0–45.0)
Hemoglobin: 12.5 g/dL (ref 11.7–15.5)
Lymphs Abs: 1825 cells/uL (ref 850–3900)
MCH: 29.4 pg (ref 27.0–33.0)
MCHC: 33.3 g/dL (ref 32.0–36.0)
MCV: 88.2 fL (ref 80.0–100.0)
MPV: 10.8 fL (ref 7.5–12.5)
Monocytes Relative: 4.4 %
Neutro Abs: 5600 cells/uL (ref 1500–7800)
Neutrophils Relative %: 71.8 %
Platelets: 301 10*3/uL (ref 140–400)
RBC: 4.25 10*6/uL (ref 3.80–5.10)
RDW: 12.9 % (ref 11.0–15.0)
Total Lymphocyte: 23.4 %
WBC: 7.8 10*3/uL (ref 3.8–10.8)

## 2020-11-08 LAB — SEDIMENTATION RATE: Sed Rate: 2 mm/h (ref 0–20)

## 2020-11-08 MED ORDER — ONDANSETRON HCL 4 MG/2ML IJ SOLN
4.0000 mg | Freq: Once | INTRAMUSCULAR | Status: AC
  Administered 2020-11-08: 4 mg via INTRAMUSCULAR

## 2020-11-08 MED ORDER — PREDNISONE 10 MG PO TABS: ORAL_TABLET | ORAL | 0 refills | Status: DC

## 2020-11-08 MED ORDER — RIZATRIPTAN BENZOATE 10 MG PO TABS: 10.0000 mg | ORAL_TABLET | ORAL | 0 refills | Status: AC | PRN

## 2020-11-08 MED ORDER — ONDANSETRON 4 MG PO TBDP: 4.0000 mg | ORAL_TABLET | Freq: Once | ORAL | Status: DC

## 2020-11-08 MED ORDER — ONDANSETRON HCL 4 MG PO TABS: 4.0000 mg | ORAL_TABLET | Freq: Three times a day (TID) | ORAL | 1 refills | Status: AC | PRN

## 2020-11-08 MED ORDER — HYDROCODONE-ACETAMINOPHEN 5-325 MG PO TABS: 1.0000 | ORAL_TABLET | Freq: Three times a day (TID) | ORAL | 0 refills | Status: DC | PRN

## 2020-11-08 NOTE — Patient Instructions (Addendum)
Take Norco 5/325 with food sparingly every 8 hours for severe headache pain.  CBC with differential and sed rate drawn today in the office.  Have Maxalt on hand in the future should you develop subsequent migraine headaches.  Take prednisone in tapering course starting with 60 mg day 1 and decreasing by 10 mg daily i.e. 6-5-4-3-2-1 taper.  Rest and drink fluids.  Try to eat something light.  Stay out of work tomorrow.

## 2020-11-08 NOTE — Telephone Encounter (Signed)
She needs home Covid test. If negative, we can book OV. She may just have a migraine.

## 2020-11-08 NOTE — Telephone Encounter (Signed)
Brianna Hess will do COVID test and call back

## 2020-11-08 NOTE — Progress Notes (Signed)
   Subjective:    Patient ID: Brianna Bucks, PA, female    DOB: Jan 03, 1986, 35 y.o.   MRN: [id number]  HPI 35 year old Female spent the day yesterday at various activities including going for a run in the morning attending a ball game in the afternoon which exposed her to a lot of sunlight, spending time with her family.  She is on her menstrual.  She subsequently came down with a rather severe headache yesterday.  Tried over-the-counter medication without relief.  She was nauseated and actually vomited which was unusual for her.  Has never had a severe headache like this previously.  Has no history of head trauma.  Finally took a leftover Roxicodone 5 mg tablet which she had from previous C-section February 2020.  Was able to get some sleep after midnight but awakened early this morning and still had headache.  She called out of work.  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  She has 2 small children.  Social alcohol consumption which is not frequent.  Does not smoke.    Review of Systems see above denies visual disturbance.  No syncope.  Feels nauseated.     Objective:   Physical Exam She is afebrile.  Blood pressure 110/70 pulse 78 pulse oximetry 97% weight 122 pounds.  She is alert and oriented x3.  PERRLA.  Extraocular movements are full.  Fundi not examined.  Normal muscle strength in the upper and lower extremities.  Cerebellar finger-to-nose testing and gait are normal.  Cranial nerves II through XII are grossly intact.       Assessment & Plan:  Not feeling is that she had onset yesterday of a migraine headache which was protracted and she has not been able to get relief and this has become status migrainosus.  She was given an injection of Zofran in the office and prescription for Zofran 4 mg tablets to take up to 3 times daily as needed for nausea.  Will be treated with a steroid Dosepak prednisone 10 mg (number 21 tablets) starting with 6 tablets day 1 and  decreasing by 1 tablet each day i.e. 6-5-4-3-2-1 taper.  She is to be out of work tomorrow rest and stay well-hydrated.  Try to eat something such as soup and crackers toast, oatmeal or grits.  Call if symptoms worsen or do not improve.  Norco 5/325 1 tablet every 8 hours as needed for severe headache.  CBC with differential and sed rate drawn today.  White blood cell count is normal at 7800.  Hemoglobin is normal at 12.5 g.  I suspect she will have additional migraine headaches in the future with her prior history.  She was given Maxalt 10 mg tablets to have on hand at onset of migraine.  We will call her for progress report tomorrow.

## 2020-11-08 NOTE — Telephone Encounter (Signed)
Brianna Hess  [phone]  Brianna Hess called to say that Brianna Hess got a really bad headache last night with throwing up and fatigue, she is currently sleeping, but would like to come in and be seen today, no fever.

## 2020-11-08 NOTE — Telephone Encounter (Signed)
See today

## 2020-11-08 NOTE — Telephone Encounter (Signed)
Home COVID test is negative

## 2020-11-08 NOTE — Telephone Encounter (Signed)
Scheduled

## 2020-11-09 ENCOUNTER — Telehealth: Payer: Self-pay

## 2020-11-09 NOTE — Telephone Encounter (Signed)
Many thanks for checking

## 2020-11-09 NOTE — Telephone Encounter (Signed)
Called to check on patient she said she is feeling so much better, no more vomiting or nausea and the headache is gone.

## 2020-12-11 DIAGNOSIS — H66001 Acute suppurative otitis media without spontaneous rupture of ear drum, right ear: Secondary | ICD-10-CM | POA: Diagnosis not present

## 2021-01-13 DIAGNOSIS — L700 Acne vulgaris: Secondary | ICD-10-CM | POA: Diagnosis not present

## 2021-04-08 DIAGNOSIS — L700 Acne vulgaris: Secondary | ICD-10-CM | POA: Diagnosis not present

## 2021-04-08 DIAGNOSIS — L680 Hirsutism: Secondary | ICD-10-CM | POA: Diagnosis not present

## 2021-05-07 IMAGING — CT CT ABD-PELV W/ CM
2 of 4 series · 15 of 46 positions shown, 17 images · IV contrast (omnipaque)
Comparison: None

CLINICAL DATA: Left upper quadrant abdominal pain for approximately
6-12 months.

EXAM:
CT ABDOMEN AND PELVIS WITH CONTRAST
TECHNIQUE: Multidetector CT imaging of the abdomen and pelvis was performed
using the standard protocol following bolus administration of
intravenous contrast.
CONTRAST:  100mL OMNIPAQUE IOHEXOL 300 MG/ML  SOLN

[Series 2: abd/pel w · axial · 0.62mm/px · z∈[-436,-81]mm · 12 of 82 slices shown, 14 images]
[im 7/82  soft-tissue]
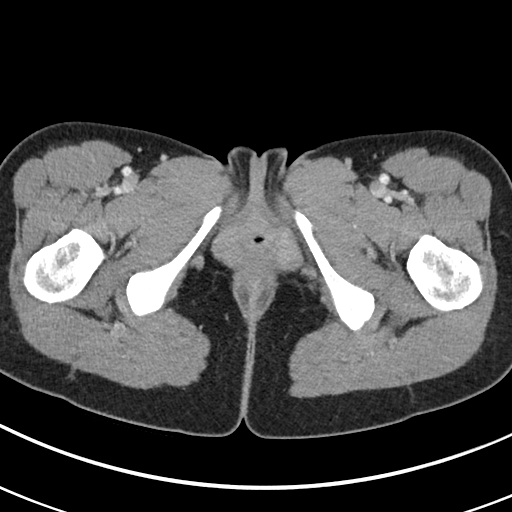
[im 7/82  bone]
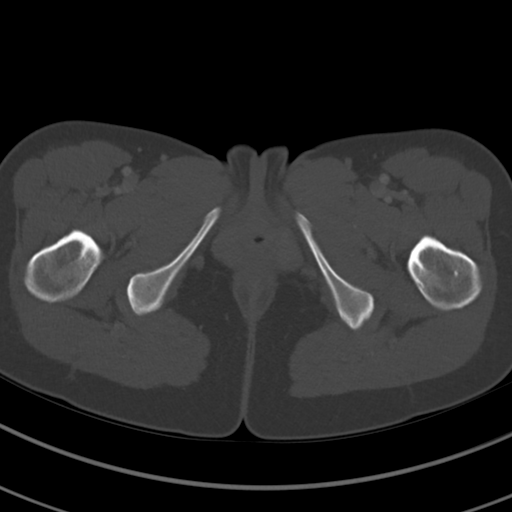
[im 13/82  soft-tissue]
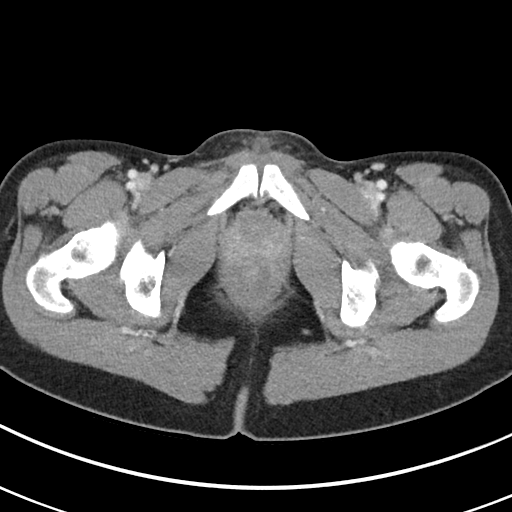
[im 20/82  soft-tissue]
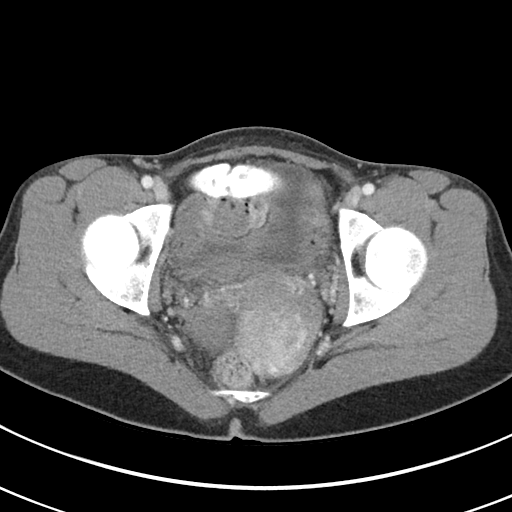
[im 26/82  soft-tissue]
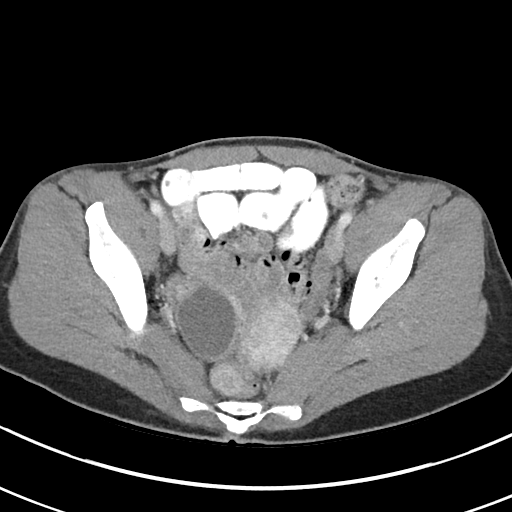
[im 33/82  soft-tissue]
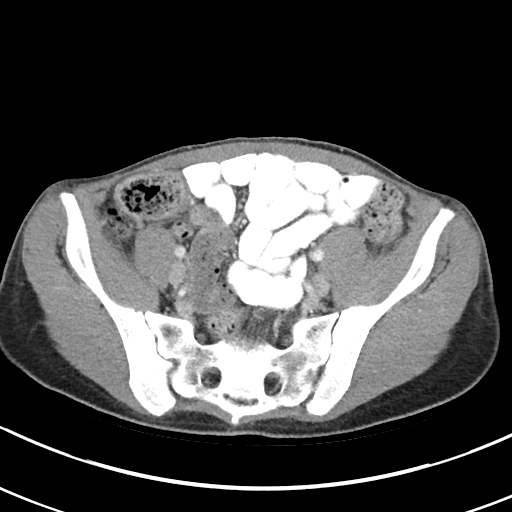
[im 39/82  soft-tissue]
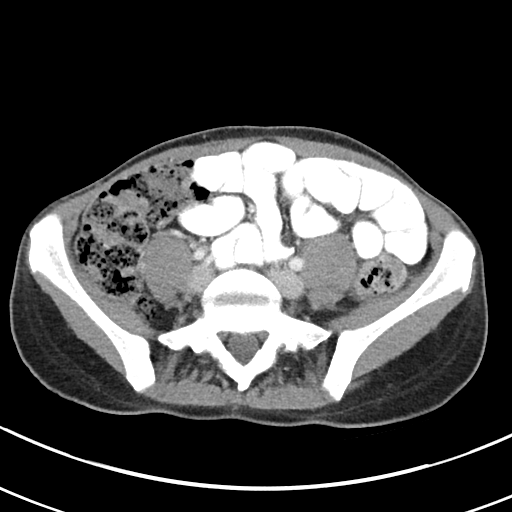
[im 46/82  soft-tissue]
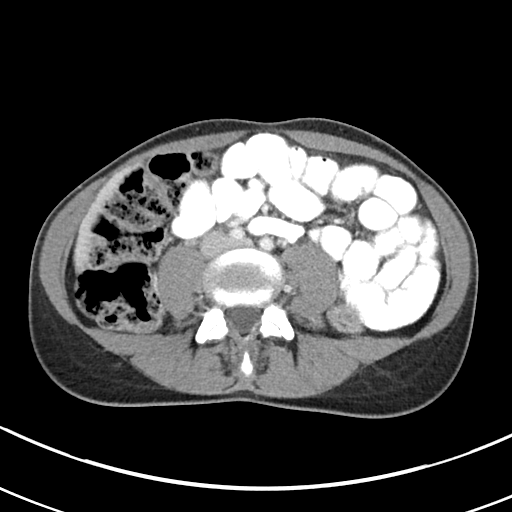
[im 52/82  soft-tissue]
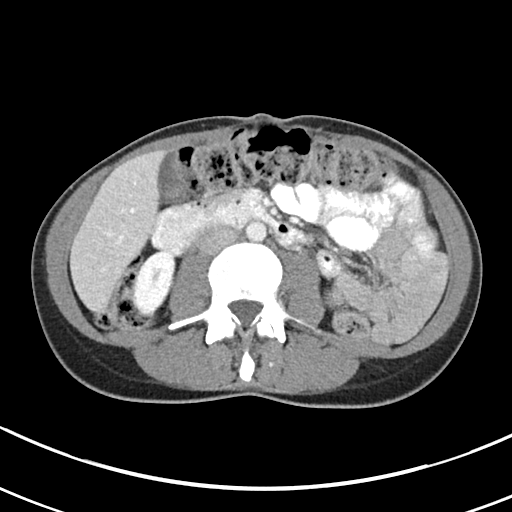
[im 59/82  soft-tissue]
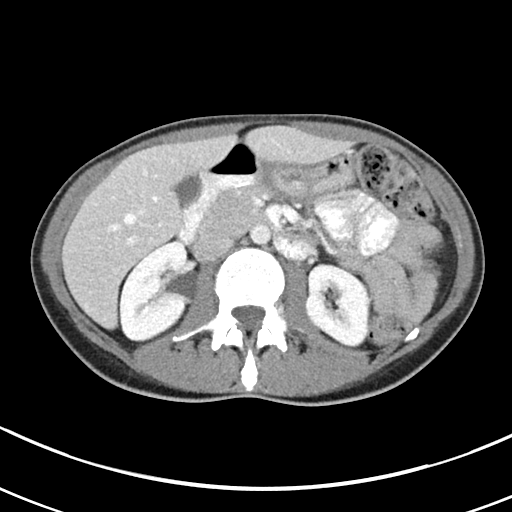
[im 59/82  bone]
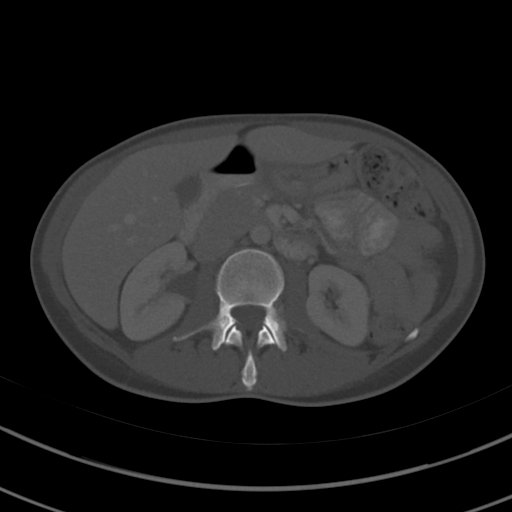
[im 65/82  soft-tissue]
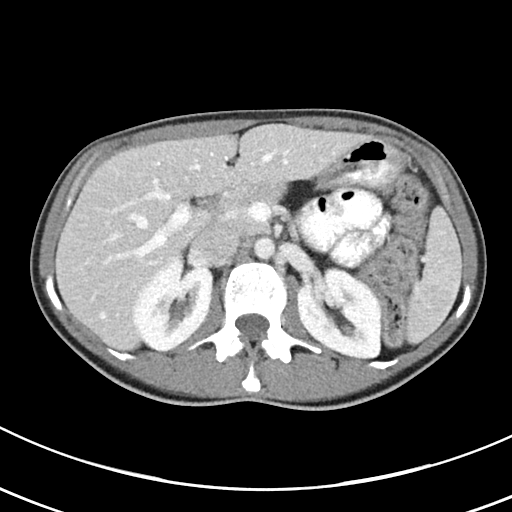
[im 72/82  soft-tissue]
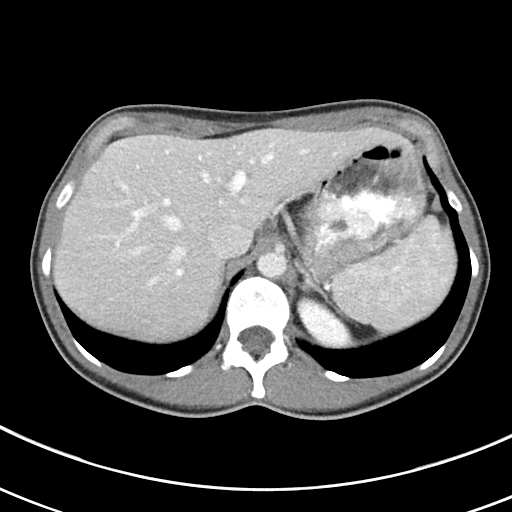
[im 78/82  soft-tissue]
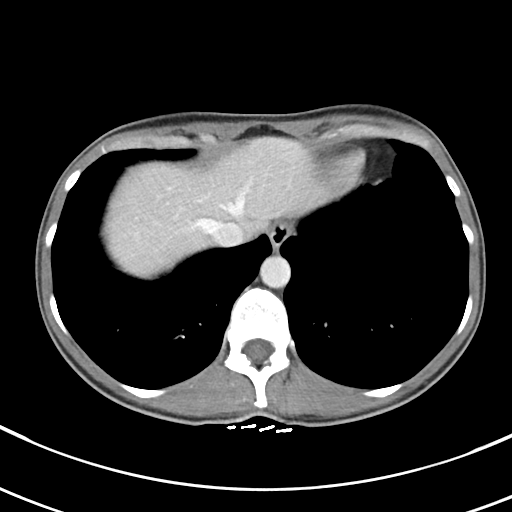

[Series 5: abd/pel w st · coronal · 0.60mm/px · 3 of 65 slices shown]
[im 22/65  soft-tissue]
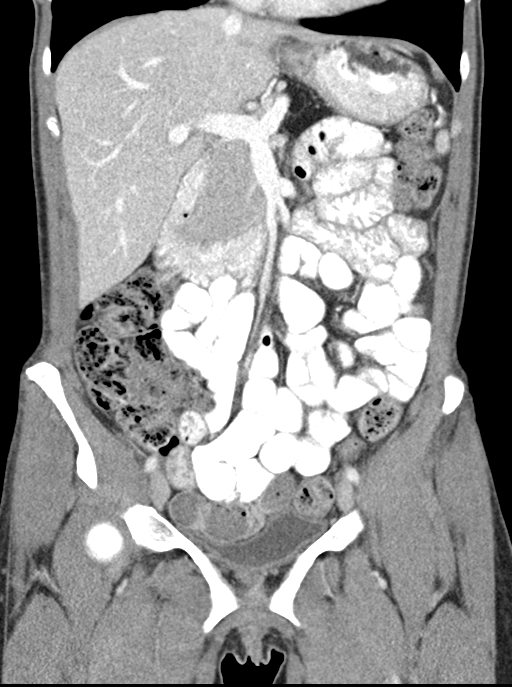
[im 29/65  soft-tissue]
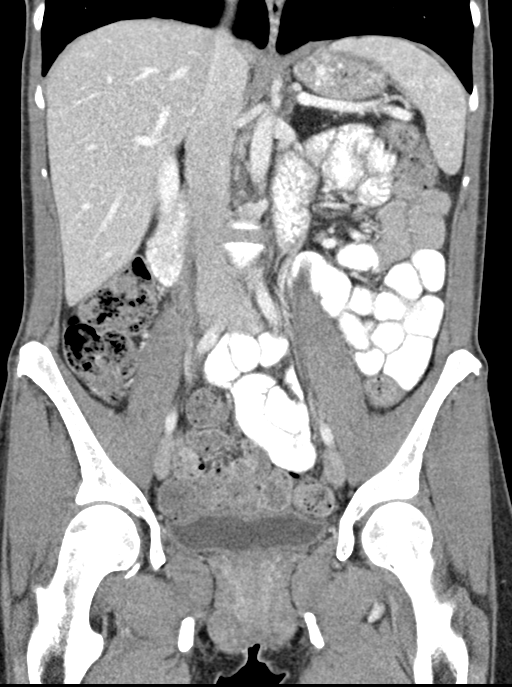
[im 36/65  soft-tissue]
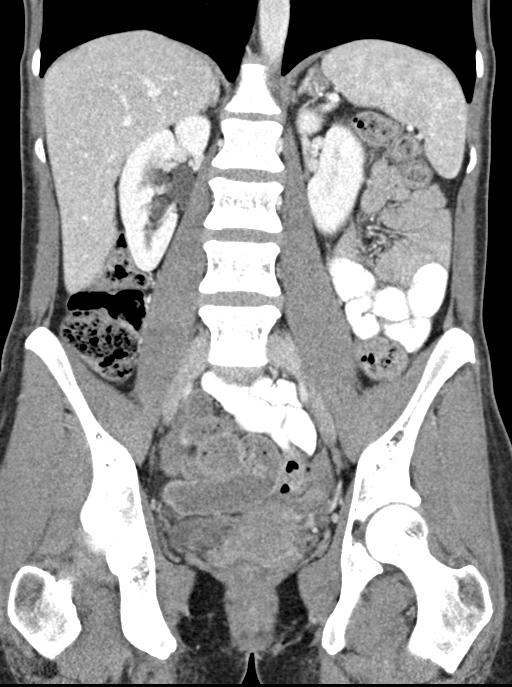

[15 of 46 positions shown; findings below may reference images not displayed]

FINDINGS: Lower chest: No acute abnormality.

Hepatobiliary: No focal liver abnormality is seen. No gallstones,
gallbladder wall thickening, or biliary dilatation.

Pancreas: Congenitally absent/atrophic tail of pancreas. No
pancreatic mass, main duct dilatation or inflammation.

Spleen: Normal in size without focal abnormality.

Adrenals/Urinary Tract: The adrenal glands are unremarkable. Small
low-density structure within the posterior cortex of the right mid
kidney measures 4 mm and is too small to characterize, image [DATE].
No suspicious mass or hydronephrosis. Urinary bladder appears
normal.

Stomach/Bowel: Stomach appears normal. No bowel wall thickening,
inflammation, or distension. The appendix is difficult to visualize
separate from the right lower quadrant bowel loops. No secondary
signs of appendicitis noted. Moderate stool burden is identified
throughout the colon.

Vascular/Lymphatic: No significant vascular findings are present. No
enlarged abdominal or pelvic lymph nodes.

Reproductive: The uterus appears normal. Well-circumscribed cyst
within the right adnexal region measures 4.2 by 3.2 cm. There is and
adjacent calcification measuring 6 mm. No internal septation or
mural nodularity, image 58/2.

Other: There is no free fluid or fluid collections.

Musculoskeletal: No acute or significant osseous abnormality
IMPRESSION: 1. No acute findings identified within the abdomen or pelvis.
2. Moderate stool burden identified throughout the colon. Correlate
for any clinical signs or symptoms of constipation.
3. Well-circumscribed cyst within the right adnexal region is
identified measuring up to 4.2 cm. There is and adjacent
calcification measuring 6 mm. Findings are favored to represent a
benign abnormality This is almost certainly benign, and no specific
imaging follow up is recommended according to the Society of
Radiologists in 5ltrasoundXJAJ Consensus Conference Statement (ERIN
Zee et al. Management of Asymptomatic Ovarian and Other Adnexal
Cysts Imaged at US: Society of Radiologists in Ultrasound Consensus
4. Congenitally absent/atrophic tail of pancreas.

## 2021-05-10 DIAGNOSIS — Z124 Encounter for screening for malignant neoplasm of cervix: Secondary | ICD-10-CM | POA: Diagnosis not present

## 2021-05-10 DIAGNOSIS — Z309 Encounter for contraceptive management, unspecified: Secondary | ICD-10-CM | POA: Diagnosis not present

## 2021-05-10 DIAGNOSIS — Z01419 Encounter for gynecological examination (general) (routine) without abnormal findings: Secondary | ICD-10-CM | POA: Diagnosis not present

## 2021-05-10 DIAGNOSIS — Z681 Body mass index (BMI) 19 or less, adult: Secondary | ICD-10-CM | POA: Diagnosis not present

## 2021-05-10 DIAGNOSIS — Z113 Encounter for screening for infections with a predominantly sexual mode of transmission: Secondary | ICD-10-CM | POA: Diagnosis not present

## 2021-05-16 DIAGNOSIS — R921 Mammographic calcification found on diagnostic imaging of breast: Secondary | ICD-10-CM | POA: Diagnosis not present

## 2021-05-16 DIAGNOSIS — N644 Mastodynia: Secondary | ICD-10-CM | POA: Diagnosis not present

## 2021-05-16 DIAGNOSIS — R922 Inconclusive mammogram: Secondary | ICD-10-CM | POA: Diagnosis not present

## 2021-05-16 DIAGNOSIS — N6341 Unspecified lump in right breast, subareolar: Secondary | ICD-10-CM | POA: Diagnosis not present

## 2021-07-12 ENCOUNTER — Telehealth: Payer: Self-pay | Admitting: Internal Medicine

## 2021-07-12 ENCOUNTER — Other Ambulatory Visit: Payer: Federal, State, Local not specified - PPO | Admitting: Internal Medicine

## 2021-07-12 NOTE — Telephone Encounter (Signed)
Called and spoke with patient she is going to take a COVID test on morning of 07/19/2021 for CPE and as long as it is negative and she is having no symptoms and no one in home is positive for COVID we will do her CPE and labs that day.

## 2021-07-12 NOTE — Telephone Encounter (Signed)
Brianna Hess [phone]  Brianna Hess came in for her labs and said her daughter was home with COVID since Wednesday and Brianna Hess has copugh, congestion and drainage but her COVID test was negative yesterday, she started getting sick on Friday. She says she does not have COVID because she had it 3 weeks ago. She wanted to reschedule her labs so she can have CPE on 07/19/2021. I let her know I needed to talk with you first.

## 2021-07-12 NOTE — Telephone Encounter (Signed)
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 What if she test negative for COVID and has no symptoms or anyone in the home on 07/19/2021.

## 2021-07-14 ENCOUNTER — Encounter: Payer: Self-pay | Admitting: Internal Medicine

## 2021-07-19 ENCOUNTER — Other Ambulatory Visit: Payer: Self-pay

## 2021-07-19 ENCOUNTER — Ambulatory Visit (INDEPENDENT_AMBULATORY_CARE_PROVIDER_SITE_OTHER): Payer: Federal, State, Local not specified - PPO | Admitting: Internal Medicine

## 2021-07-19 ENCOUNTER — Encounter: Payer: Self-pay | Admitting: Internal Medicine

## 2021-07-19 VITALS — BP 108/80 | HR 67 | Temp 97.3°F | Ht 68.5 in | Wt 128.0 lb

## 2021-07-19 DIAGNOSIS — Z0000 Encounter for general adult medical examination without abnormal findings: Secondary | ICD-10-CM

## 2021-07-19 DIAGNOSIS — Z13220 Encounter for screening for lipoid disorders: Secondary | ICD-10-CM

## 2021-07-19 DIAGNOSIS — E7800 Pure hypercholesterolemia, unspecified: Secondary | ICD-10-CM | POA: Diagnosis not present

## 2021-07-19 DIAGNOSIS — Z1329 Encounter for screening for other suspected endocrine disorder: Secondary | ICD-10-CM | POA: Diagnosis not present

## 2021-07-19 NOTE — Patient Instructions (Addendum)
It was a pleasure to see you today.  Return in 1 year or as needed.  Labs are drawn and results are pending.  At age 36 pharynx: No exudate no exudate

## 2021-07-19 NOTE — Progress Notes (Signed)
Acute Office Visit  Subjective:    Patient ID: Brianna Decamp, PA, female    DOB: 1985/09/30, 36 y.o.   MRN: [id number]  Chief Complaint  Patient presents with   Annual Exam    HPI Patient is in today for health maintenance exam. 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  This gives her more time with her children.  Her general health is excellent.  She is currently on spironolactone per Dermatology.  History of allergic rhinitis treated with Zyrtec.  No known drug allergies.  Past medical history: C-section in 2017 and 2020.  2 pregnancies and no miscarriages.  No history of serious illnesses or accidents.  General health is excellent.  Gets annual flu vaccine through employment.  This was given in October 2022.  Had tetanus immunization in 2019.  Social history: She is married.  Husband is employed as an Forensic psychologist with [employer].  She has 2 children.  She works full-time.  She has a Scientist, water quality.  Does not smoke.  Infrequent alcohol consumption.  She enjoys biking and running.  Family history: Father age 68 with history of sarcoidosis recently had coronary stent.  Mother age 17 with no significant medical problems.  1 sister with history of Hashimoto's thyroiditis.  No brothers.  Dr. Lisbeth Renshaw is her GYN physician.  History reviewed. No pertinent past medical history.  Past Surgical History:  Procedure Laterality Date   CESAREAN SECTION     CESAREAN SECTION N/A 08/19/2018   Procedure: Repeat CESAREAN SECTION;  Surgeon: Azucena Fallen, MD;  Location: [facility];  Service: Obstetrics;  Laterality: N/A;  EDD: 08/25/18   CESAREAN SECTION CLASSICAL     DILATION AND CURETTAGE OF UTERUS     uterine polyps   TONSILLECTOMY      Family History  Problem Relation Age of Onset   Healthy Mother    Sarcoidosis Father    Heart disease Father         Stent placement   Hashimoto's thyroiditis Sister    Colon cancer Neg Hx    Stomach cancer Neg Hx    Esophageal cancer Neg Hx    Rectal cancer Neg Hx    Pancreatic cancer Neg Hx     Social History   Socioeconomic History   Marital status: Married    Spouse name: Not on file   Number of children: Not on file   Years of education: Not on file   Highest education level: Not on file  Occupational History   Not on file  Tobacco Use   Smoking status: Never   Smokeless tobacco: Never  Vaping Use   Vaping Use: Never used  Substance and Sexual Activity   Alcohol use: Yes    Comment: TWICE A WEEK   Drug use: Never   Sexual activity: Not on file  Other Topics Concern   Not on file  Social History Narrative   Social history: She is married.  Husband is employed as an Forensic psychologist with [employer].  She has 2 daughters and 74-year-old a 18-month-old.  Patient works full-time.  She has a Scientist, water quality.  Does not smoke.  Social alcohol consumption- not frequent.  Enjoys biking and running.       Family history: Father age 41 with sarcoidosis.  Mother age 38 with no significant medical problems.  One sister age 20  with history of Hashimoto's thyroiditis.  No brothers.  Dr. Tildon Husky is her GYN physician.   Social Determinants of Health   Financial Resource Strain: Not on file  Food Insecurity: Not on file  Transportation Needs: Not on file  Physical Activity: Not on file  Stress: Not on file  Social Connections: Not on file  Intimate Partner Violence: Not on file    Outpatient Medications Prior to Visit  Medication Sig Dispense Refill   albuterol (VENTOLIN HFA) 108 (90 Base) MCG/ACT inhaler Inhale 2 puffs into the lungs every 6 (six) hours as needed for wheezing or shortness of breath. 6.7 g 2   cetirizine (ZYRTEC) 10 MG tablet Take 10 mg by mouth daily.      ondansetron (ZOFRAN) 4 MG tablet Take 1 tablet (4 mg total) by mouth every 8 (eight) hours as needed for nausea or  vomiting. 20 tablet 1   rizatriptan (MAXALT) 10 MG tablet Take 1 tablet (10 mg total) by mouth as needed for migraine. May repeat in 2 hours if needed 10 tablet 0   spironolactone (ALDACTONE) 50 MG tablet Take 200 mg by mouth daily.     WINLEVI 1 % CREA SMARTSIG:Sparingly Topical Twice Daily     HYDROcodone-acetaminophen (NORCO) 5-325 MG tablet Take 1 tablet by mouth every 8 (eight) hours as needed for moderate pain. 15 tablet 0   predniSONE (DELTASONE) 10 MG tablet Take in tapering course as directed 6-5-4-3-2-1 21 tablet 0   No facility-administered medications prior to visit.    No Known Allergies  Review of Systems no new complaints     Objective:    Physical Exam  BP 108/80    Pulse 67    Temp (!) 97.3 F (36.3 C) (Tympanic)    Ht 5' 8.5" (1.74 m)    Wt 128 lb (58.1 kg)    SpO2 99%    BMI 19.18 kg/m  Wt Readings from Last 3 Encounters:  07/19/21 128 lb (58.1 kg)  11/08/20 122 lb (55.3 kg)  06/18/20 126 lb (57.2 kg)  Skin: Warm and dry.  No cervical adenopathy.  No thyromegaly.  Pharynx slightly injected.  No cervical adenopathy.  Chest is clear to auscultation.  Breast are without masses.  Cardiac exam: Regular rate and rhythm without murmur or ectopy.  Abdomen is soft nondistended without hepatosplenomegaly masses or tenderness.  Small reducible umbilical hernia.  No deformity of the extremities.  No lower extremity edema.  Neurological exam is intact without gross focal deficits.  Affect thought and judgment are normal.  Health Maintenance Due  Topic Date Due   Hepatitis C Screening  Never done  Deferred  There are no preventive care reminders to display for this patient. Fasting labs are drawn today and are pending Results from 2020 Lab Results  Component Value Date   TSH 1.36 04/01/2019   Lab Results  Component Value Date   WBC 7.8 11/08/2020   HGB 12.5 11/08/2020   HCT 37.5 11/08/2020   MCV 88.2 11/08/2020   PLT 301 11/08/2020   Lab Results  Component Value  Date   NA 138 12/29/2019   K 4.2 12/29/2019   CO2 28 12/29/2019   GLUCOSE 83 12/29/2019   BUN 12 12/29/2019   CREATININE 0.73 12/29/2019   BILITOT 1.2 12/03/2019   AST 16 12/03/2019   ALT 11 12/03/2019   PROT 7.5 12/03/2019   CALCIUM 9.4 12/29/2019   GFR 90.30 12/29/2019   Lab Results  Component Value Date  CHOL 178 04/01/2019   Lab Results  Component Value Date   HDL 56 04/01/2019   Lab Results  Component Value Date   LDLCALC 108 (H) 04/01/2019   Lab Results  Component Value Date   TRIG 46 04/01/2019   Lab Results  Component Value Date   CHOLHDL 3.2 04/01/2019        Assessment & Plan:   Normal health maintenance exam  Fasting labs drawn and pending  Plan: Return in 1 year or as needed.     Elby Showers, MD

## 2021-07-20 LAB — COMPLETE METABOLIC PANEL WITH GFR
AG Ratio: 1.6 (calc) (ref 1.0–2.5)
ALT: 14 U/L (ref 6–29)
AST: 21 U/L (ref 10–30)
Albumin: 4.4 g/dL (ref 3.6–5.1)
Alkaline phosphatase (APISO): 49 U/L (ref 31–125)
BUN: 10 mg/dL (ref 7–25)
CO2: 28 mmol/L (ref 20–32)
Calcium: 9.5 mg/dL (ref 8.6–10.2)
Chloride: 102 mmol/L (ref 98–110)
Creat: 0.74 mg/dL (ref 0.50–0.97)
Globulin: 2.7 g/dL (calc) (ref 1.9–3.7)
Glucose, Bld: 84 mg/dL (ref 65–99)
Potassium: 3.8 mmol/L (ref 3.5–5.3)
Sodium: 139 mmol/L (ref 135–146)
Total Bilirubin: 0.9 mg/dL (ref 0.2–1.2)
Total Protein: 7.1 g/dL (ref 6.1–8.1)
eGFR: 107 mL/min/{1.73_m2} (ref >=60)

## 2021-07-20 LAB — CBC WITH DIFFERENTIAL/PLATELET
Absolute Monocytes: 364 cells/uL (ref 200–950)
Basophils Absolute: 21 cells/uL (ref 0–200)
Basophils Relative: 0.4 %
Eosinophils Absolute: 52 cells/uL (ref 15–500)
Eosinophils Relative: 1 %
HCT: 37.2 % (ref 35.0–45.0)
Hemoglobin: 12.5 g/dL (ref 11.7–15.5)
Lymphs Abs: 2506 cells/uL (ref 850–3900)
MCH: 30.5 pg (ref 27.0–33.0)
MCHC: 33.6 g/dL (ref 32.0–36.0)
MCV: 90.7 fL (ref 80.0–100.0)
MPV: 10.4 fL (ref 7.5–12.5)
Monocytes Relative: 7 %
Neutro Abs: 2257 cells/uL (ref 1500–7800)
Neutrophils Relative %: 43.4 %
Platelets: 273 10*3/uL (ref 140–400)
RBC: 4.1 10*6/uL (ref 3.80–5.10)
RDW: 13.5 % (ref 11.0–15.0)
Total Lymphocyte: 48.2 %
WBC: 5.2 10*3/uL (ref 3.8–10.8)

## 2021-07-20 LAB — LIPID PANEL
Cholesterol: 195 mg/dL (ref <200)
HDL: 56 mg/dL (ref >=50)
LDL Cholesterol (Calc): 123 mg/dL (calc) — ABNORMAL HIGH
Non-HDL Cholesterol (Calc): 139 mg/dL (calc) — ABNORMAL HIGH (ref <130)
Total CHOL/HDL Ratio: 3.5 (calc) (ref <5.0)
Triglycerides: 66 mg/dL (ref <150)

## 2021-07-20 LAB — TSH: TSH: 1.19 mIU/L

## 2021-08-29 ENCOUNTER — Other Ambulatory Visit: Payer: Self-pay

## 2021-08-29 ENCOUNTER — Telehealth: Payer: Self-pay | Admitting: Internal Medicine

## 2021-08-29 ENCOUNTER — Ambulatory Visit: Payer: Federal, State, Local not specified - PPO | Admitting: Internal Medicine

## 2021-08-29 ENCOUNTER — Encounter: Payer: Self-pay | Admitting: Internal Medicine

## 2021-08-29 VITALS — BP 118/88 | HR 69 | Temp 98.6°F | Wt 128.8 lb

## 2021-08-29 DIAGNOSIS — B029 Zoster without complications: Secondary | ICD-10-CM | POA: Diagnosis not present

## 2021-08-29 MED ORDER — HYDROCODONE-ACETAMINOPHEN 10-325 MG PO TABS: 1.0000 | ORAL_TABLET | Freq: Three times a day (TID) | ORAL | 0 refills | Status: DC | PRN

## 2021-08-29 MED ORDER — VALACYCLOVIR HCL 1 G PO TABS: 1000.0000 mg | ORAL_TABLET | Freq: Three times a day (TID) | ORAL | 0 refills | Status: DC

## 2021-08-29 MED ORDER — GABAPENTIN 100 MG PO CAPS: 100.0000 mg | ORAL_CAPSULE | Freq: Three times a day (TID) | ORAL | 1 refills | Status: DC

## 2021-08-29 MED ORDER — HYDROCODONE-ACETAMINOPHEN 10-325 MG PO TABS: 1.0000 | ORAL_TABLET | Freq: Three times a day (TID) | ORAL | 0 refills | Status: AC | PRN

## 2021-08-29 NOTE — Patient Instructions (Signed)
I think you can expect to have neuropathic pain for up to 3 weeks.  Hopefully gabapentin will help.  Given hydrocodone APAP to take sparingly as well for pain.  Please take Valtrex 1000 mg 3 times daily for 7 days.

## 2021-08-29 NOTE — Progress Notes (Signed)
° °  Subjective:    Patient ID: Silverio Decamp, female    DOB: [birth year], 36 y.o.   MRN: [id number]  HPI Healthy 36 year old Female recently accepted a new job and has been quite busy at work.  Continues to works on the weekends for [employer].  Is also a busy mother.  Last Thursday, February 2, she began to experience, some left posterior thoracic chest wall pain.  Over the weekend, she began to see isolated vesicular lesions along lower left thoracic posterior thoracic area apprx T 10-11 area with persistent pain in that area.    Review of Systems     Objective:   Physical Exam She is afebrile.  Pulse 69 and regular blood pressure 118/88 pulse oximetry 99% BMI 19.29  She has discrete erythematous papular lesions with central vesicle along lower thoracic rib cage area posteriorly.  These are consistent with herpes zoster.       Assessment & Plan:  Symptoms and clinical findings of vesicular lesions are consistent with Herpes zoster.  Patient has history of chickenpox as a child.  The pain has been particularly bothersome and is interfering with her sleep.  Plan: She will be started on Valtrex 1000 mg 3 times a day for 7 days.  Was given hydrocodone APAP 10/325 to take every 8 hours as needed for pain #15 with no refill.  Trial of gabapentin 100 mg 3 times daily for nerve root pain.

## 2021-08-29 NOTE — Telephone Encounter (Signed)
Brianna Hess [phone]  Nyja called say she had a lot of back pain that wrap around to her side and a rash. She stated she thinks it is shingles. I went ahead and schedule her for 10:45

## 2021-08-29 NOTE — Progress Notes (Deleted)
Sh

## 2021-09-14 ENCOUNTER — Other Ambulatory Visit: Payer: Self-pay | Admitting: Internal Medicine

## 2021-10-07 DIAGNOSIS — L700 Acne vulgaris: Secondary | ICD-10-CM | POA: Diagnosis not present

## 2021-10-07 DIAGNOSIS — L680 Hirsutism: Secondary | ICD-10-CM | POA: Diagnosis not present

## 2021-12-08 ENCOUNTER — Telehealth: Payer: Self-pay | Admitting: Internal Medicine

## 2021-12-08 NOTE — Telephone Encounter (Signed)
scheduled

## 2021-12-08 NOTE — Telephone Encounter (Signed)
Brianna Hess [phone]  [name] called to say she would like to come in a talk to you about her having attention issues and discuss what options she has.

## 2021-12-15 ENCOUNTER — Ambulatory Visit: Payer: Federal, State, Local not specified - PPO | Admitting: Internal Medicine

## 2021-12-15 ENCOUNTER — Encounter: Payer: Self-pay | Admitting: Internal Medicine

## 2021-12-15 VITALS — BP 108/80 | HR 75 | Temp 98.0°F | Ht 68.5 in | Wt 130.5 lb

## 2021-12-15 DIAGNOSIS — R41840 Attention and concentration deficit: Secondary | ICD-10-CM

## 2021-12-15 MED ORDER — AMPHETAMINE-DEXTROAMPHETAMINE 10 MG PO TABS: 10.0000 mg | ORAL_TABLET | Freq: Two times a day (BID) | ORAL | 0 refills | Status: DC

## 2021-12-15 NOTE — Progress Notes (Signed)
   Subjective:    Patient ID: Brianna Hess, female    DOB: May 05, 1986, 36 y.o.   MRN: [id number]  HPI 36 year old Female seen today to discuss attention issues. 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 However, finds that she is distracted often and has difficulty concentrating on her work.  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  Concentration was a problem especially if subject was not interesting. However, now there is a lot of stopping and re-starting same tasks at times. Does not feel she is able to work efficiently.   No hx of HTN. No cardiac issues. General health is excellent. No sleep issues. No depression.   Also, will be moving to the [facility]. area in the near future as husband has new job opportunity.  History of migraine headaches.  History of reactive airways disease.  Had herpes zoster in February 2023.  No history of serious illnesses or accidents and general health is excellent.  She does not smoke.  Infrequent alcohol consumption.  Exercises regularly.    Review of Systems see above     Objective:   Physical Exam Blood pressure 108/80 pulse 75 temperature 98 degrees orally pulse oximetry 99% weight 130 pounds 8 ounces BMI 19.5       Assessment & Plan:  Mild attention deficit issues  Plan: We are going to try Adderall 10 mg up to twice daily if needed.  May just need it 1 time only during work hours and it should last 4 to 6 hours.  She knows to be careful with alcohol while taking Adderall.  She will let me know how this does for her.

## 2021-12-15 NOTE — Patient Instructions (Signed)
Trial of Adderall 10 mg up to twice daily if needed for attention issues.  She will let me know how this works for her.

## 2022-05-09 ENCOUNTER — Telehealth: Payer: Self-pay | Admitting: Internal Medicine

## 2022-05-09 MED ORDER — AMPHETAMINE-DEXTROAMPHETAMINE 10 MG PO TABS: 10.0000 mg | ORAL_TABLET | Freq: Two times a day (BID) | ORAL | 0 refills | Status: AC

## 2022-05-09 NOTE — Telephone Encounter (Signed)
Patient called and said she moved out of the area and is going to have to switch to a new PCP that's closer to her new location. She has an appointment with new PCP 05/18/22. She wanted to know if Dr Renold Genta would fill amphetamine-dextroamphetamine (ADDERALL) 10 MG tablet until then. Please advise. Callback is [phone]. [facility] at Address: [address], [place] [address], Phone [phone]

## 2022-05-09 NOTE — Telephone Encounter (Signed)
Have refilled Adderall for 30 days until patient can establish with new PCP in [facility] area. MJB,MD PMP aware checked.
